# Patient Record
Sex: Female | Born: 1950 | Race: White | Hispanic: No
Health system: Southern US, Community
[De-identification: ages and names within clinical notes are randomized; demographics above are authoritative.]

---

## 1999-04-29 ENCOUNTER — Inpatient Hospital Stay (HOSPITAL_COMMUNITY): Admission: EM | Admit: 1999-04-29 | Discharge: 1999-05-03 | Payer: Self-pay | Admitting: Psychiatry

## 1999-07-21 ENCOUNTER — Other Ambulatory Visit: Admission: RE | Admit: 1999-07-21 | Discharge: 1999-07-21 | Payer: Self-pay | Admitting: Family Medicine

## 2002-07-16 ENCOUNTER — Emergency Department (HOSPITAL_COMMUNITY): Admission: EM | Admit: 2002-07-16 | Discharge: 2002-07-16 | Payer: Self-pay | Admitting: Emergency Medicine

## 2003-05-27 ENCOUNTER — Emergency Department (HOSPITAL_COMMUNITY): Admission: EM | Admit: 2003-05-27 | Discharge: 2003-05-27 | Payer: Self-pay | Admitting: Emergency Medicine

## 2004-03-13 ENCOUNTER — Ambulatory Visit: Payer: Self-pay | Admitting: Family Medicine

## 2004-04-04 ENCOUNTER — Ambulatory Visit: Payer: Self-pay | Admitting: Family Medicine

## 2004-04-07 ENCOUNTER — Ambulatory Visit: Payer: Self-pay | Admitting: Internal Medicine

## 2004-04-08 ENCOUNTER — Ambulatory Visit: Payer: Self-pay | Admitting: *Deleted

## 2004-04-24 ENCOUNTER — Inpatient Hospital Stay (HOSPITAL_COMMUNITY): Admission: EM | Admit: 2004-04-24 | Discharge: 2004-04-30 | Payer: Self-pay | Admitting: Emergency Medicine

## 2004-04-25 ENCOUNTER — Ambulatory Visit: Payer: Self-pay | Admitting: Cardiology

## 2004-04-25 ENCOUNTER — Encounter: Payer: Self-pay | Admitting: Cardiology

## 2004-05-07 ENCOUNTER — Ambulatory Visit: Payer: Self-pay | Admitting: Family Medicine

## 2004-05-12 ENCOUNTER — Ambulatory Visit: Payer: Self-pay | Admitting: Family Medicine

## 2004-05-22 ENCOUNTER — Ambulatory Visit: Payer: Self-pay | Admitting: Family Medicine

## 2004-05-27 ENCOUNTER — Ambulatory Visit (HOSPITAL_COMMUNITY): Admission: RE | Admit: 2004-05-27 | Discharge: 2004-05-27 | Payer: Self-pay | Admitting: Family Medicine

## 2004-09-25 ENCOUNTER — Ambulatory Visit: Payer: Self-pay | Admitting: Family Medicine

## 2006-08-13 ENCOUNTER — Emergency Department (HOSPITAL_COMMUNITY): Admission: EM | Admit: 2006-08-13 | Discharge: 2006-08-13 | Payer: Self-pay | Admitting: Emergency Medicine

## 2009-07-07 ENCOUNTER — Inpatient Hospital Stay (HOSPITAL_COMMUNITY): Admission: EM | Admit: 2009-07-07 | Discharge: 2009-07-11 | Payer: Self-pay | Admitting: Emergency Medicine

## 2009-07-13 ENCOUNTER — Ambulatory Visit: Payer: Self-pay | Admitting: Family Medicine

## 2010-04-06 LAB — COMPREHENSIVE METABOLIC PANEL
ALT: 36 U/L — ABNORMAL HIGH (ref 0–35)
AST: 72 U/L — ABNORMAL HIGH (ref 0–37)
Albumin: 2.1 g/dL — ABNORMAL LOW (ref 3.5–5.2)
Albumin: 2.5 g/dL — ABNORMAL LOW (ref 3.5–5.2)
Alkaline Phosphatase: 253 U/L — ABNORMAL HIGH (ref 39–117)
BUN: 1 mg/dL — ABNORMAL LOW (ref 6–23)
CO2: 28 mEq/L (ref 19–32)
CO2: 29 mEq/L (ref 19–32)
Calcium: 8 mg/dL — ABNORMAL LOW (ref 8.4–10.5)
Calcium: 8.4 mg/dL (ref 8.4–10.5)
Calcium: 8.9 mg/dL (ref 8.4–10.5)
Chloride: 100 mEq/L (ref 96–112)
Chloride: 102 mEq/L (ref 96–112)
Creatinine, Ser: 0.44 mg/dL (ref 0.4–1.2)
GFR calc Af Amer: >60 mL/min (ref >60)
GFR calc non Af Amer: >60 mL/min (ref >60)
GFR calc non Af Amer: >60 mL/min (ref >60)
GFR calc non Af Amer: >60 mL/min (ref >60)
Glucose, Bld: 105 mg/dL — ABNORMAL HIGH (ref 70–99)
Potassium: 3.7 mEq/L (ref 3.5–5.1)
Potassium: 3.8 mEq/L (ref 3.5–5.1)
Sodium: 134 mEq/L — ABNORMAL LOW (ref 135–145)
Total Bilirubin: 0.7 mg/dL (ref 0.3–1.2)
Total Protein: 7.9 g/dL (ref 6.0–8.3)

## 2010-04-06 LAB — SAMPLE TO BLOOD BANK

## 2010-04-06 LAB — CBC
HCT: 41 % (ref 36.0–46.0)
HCT: 50.5 % — ABNORMAL HIGH (ref 36.0–46.0)
MCHC: 32.5 g/dL (ref 30.0–36.0)
MCV: 93.9 fL (ref 78.0–100.0)
MCV: 94 fL (ref 78.0–100.0)
Platelets: ADEQUATE 10*3/uL (ref 150–400)
RDW: 14.5 % (ref 11.5–15.5)
WBC: 12.1 10*3/uL — ABNORMAL HIGH (ref 4.0–10.5)
WBC: 13.4 10*3/uL — ABNORMAL HIGH (ref 4.0–10.5)

## 2010-04-06 LAB — URINALYSIS, ROUTINE W REFLEX MICROSCOPIC
Bilirubin Urine: NEGATIVE
Glucose, UA: NEGATIVE mg/dL
Ketones, ur: NEGATIVE mg/dL
Nitrite: NEGATIVE
Protein, ur: NEGATIVE mg/dL
Specific Gravity, Urine: 1.011 (ref 1.005–1.030)
Urobilinogen, UA: 1 mg/dL (ref 0.0–1.0)

## 2010-04-06 LAB — DIFFERENTIAL
Basophils Absolute: 0.1 10*3/uL (ref 0.0–0.1)
Basophils Absolute: 0.1 10*3/uL (ref 0.0–0.1)
Basophils Relative: 1 % (ref 0–1)
Eosinophils Absolute: 0 10*3/uL (ref 0.0–0.7)
Lymphocytes Relative: 19 % (ref 12–46)
Lymphocytes Relative: 9 % — ABNORMAL LOW (ref 12–46)
Lymphs Abs: 1.2 10*3/uL (ref 0.7–4.0)
Monocytes Relative: 10 % (ref 3–12)
Monocytes Relative: 6 % (ref 3–12)
Neutrophils Relative %: 84 % — ABNORMAL HIGH (ref 43–77)
WBC Morphology: INCREASED

## 2010-04-06 LAB — LIPASE, BLOOD: Lipase: 23 U/L (ref 11–59)

## 2010-04-06 LAB — BASIC METABOLIC PANEL
BUN: 1 mg/dL — ABNORMAL LOW (ref 6–23)
Calcium: 8.1 mg/dL — ABNORMAL LOW (ref 8.4–10.5)
Creatinine, Ser: 0.46 mg/dL (ref 0.4–1.2)
GFR calc non Af Amer: >60 mL/min (ref >60)

## 2010-04-06 LAB — PROTIME-INR
INR: 1.13 (ref 0.00–1.49)
INR: 3.15 — ABNORMAL HIGH (ref 0.00–1.49)
Prothrombin Time: 14.4 seconds (ref 11.6–15.2)
Prothrombin Time: 25.5 seconds — ABNORMAL HIGH (ref 11.6–15.2)
Prothrombin Time: 32.1 seconds — ABNORMAL HIGH (ref 11.6–15.2)
Prothrombin Time: 35.4 seconds — ABNORMAL HIGH (ref 11.6–15.2)

## 2010-04-06 LAB — GAMMA GT: GGT: 278 U/L — ABNORMAL HIGH (ref 7–51)

## 2010-04-06 LAB — URINE CULTURE

## 2010-04-06 LAB — HEPATITIS C ANTIBODY: HCV Ab: NEGATIVE

## 2010-04-06 LAB — HEPATITIS A ANTIBODY, TOTAL: Hep A Total Ab: POSITIVE — AB

## 2010-04-06 LAB — APTT: aPTT: 66 seconds — ABNORMAL HIGH (ref 24–37)

## 2010-06-06 NOTE — Consult Note (Signed)
Stacey Lynch, Stacey Lynch                ACCOUNT NO.:  [id number]   MEDICAL RECORD NO.:  [id number]          PATIENT TYPE:  INP   LOCATION:  0442                         FACILITY:  [facility]   PHYSICIAN:  Pramod P. Pearlean Brownie, MD    DATE OF BIRTH:  [birth year]   DATE OF CONSULTATION:  DATE OF DISCHARGE:                                   CONSULTATION   REFERRING PHYSICIAN:  Dr. Mikeal Hawthorne.   REASON FOR REFERRAL:  Stroke.   HISTORY OF PRESENT ILLNESS:  Stacey Lynch is a 60 year old Caucasian lady who  developed sudden onset of dizziness, gait ataxia, nausea, and vomiting 2  days ago. She was barely able to walk and had to hold on while walking. She  presented to the emergency room at [facility] where she was  admitted. Initial CT scan showed no definite stroke. However, subsequent MRI  has shown bilateral cerebellar infarcts. The patient states nausea and  vomiting have improved in the last couple of days but she still feels quite  ataxic and dizzy when walking. She has been able to walk with some support.  She denies any known prior history of stroke, TIA, seizures, significant  neurological problems.   PAST MEDICAL HISTORY:  Significant for anxiety/depression, gastroesophageal  reflux disease, allergies.   HOME MEDICATIONS:  Wellbutrin, Klonopin, and over-the-counter medications  for allergies.   MEDICATION ALLERGIES:  None.   FAMILY HISTORY:  Significant for brother having had a stroke or a hemorrhage  - the patient is unsure - at age 61. No other neurological problems.   SOCIAL HISTORY:  The patient lives in [place]. She smokes one to two  packs per day for 20 years. She denies drinking alcohol or illicit substance  abuse.   REVIEW OF SYSTEMS:  Not significant for recent fever, loss of weight, cough,  chest pain, diarrhea, or other illness.   PHYSICAL EXAMINATION:  GENERAL:  Reveals a pleasant middle-aged Caucasian  60 year old lady who is not in distress.  VITAL SIGNS:  She is afebrile  today, pulse rate of 72 per minute and  regular, respiratory rate 16 per minute, blood pressure 143/89, saturations  99% on room air. Distal pulses well felt.  HEENT:  Head is nontender. Neck is supple without bruit. ENT exam  unremarkable.  CARDIAC:  No murmur or gallop.  LUNGS:  Clear to auscultation.  NEUROLOGIC:  The patient is pleasant, awake, alert, cooperative. There is no  aphasia, apraxia, or dysarthria. Pupils are equally reactive to light and  accommodation. Eye movements are full range. There is a right beating  nystagmus on right gaze. Visual fields are full to confrontational testing.  Face is symmetric. Palatal movements normal. Tongue is midline. Motor system  exam reveals no upper extremity drift. Symmetric strength, tone, reflexes,  as well as coordination. Finger-to-nose coordination is quite accurate  bilaterally. She is unsteady on a narrow base and while walking tends to  lean to the right. She is unsteady while standing on either foot unsupported  and while walking tandem.   DATA REVIEW:  MRI scan of the brain done yesterday shows  large right  cerebellar infarction in the territory of the posterior inferior cerebellar  artery. There is a smaller infarction in the left cerebellum as well. There  is a probable subacute infarction in the right corona radiata. MRA of the  brain reveals diminished size of intracranial vessels but no high-grade  stenosis or occlusion is seen.   Cardiac echocardiogram and stroke labs are pending.   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 Vascular risk factors are smoking  and probable hypertension, and hyperlipidemia and diabetes.   PLAN:  I would recommend changing aspirin to Aggrenox for secondary stroke  prevention. Obtain MRA of the neck with and without contrast to rule out  possible vertebral artery stenosis. Check 2-D echocardiogram as well as  fasting  lipid profile, hemoglobin A1c, and homocysteine levels. Continue  physical and occupational therapy. The patient will benefit with rehab  consult. Also, telemetry monitoring for cardiac arrhythmias. The patient has  agreed to quit smoking and she will benefit with smoking cessation  counseling. I look forward to seeing her in follow-up and call for  questions.      PPS/MEDQ  D:  04/25/2004  T:  04/25/2004  Job:  [id number]   cc:   Clydie Braun L. Hal Hope, M.D.  [address] [place]  [place] [address]  Fax: [phone]   [facility]

## 2010-06-06 NOTE — H&P (Signed)
NAMETEJAH, BREKKE                ACCOUNT NO.:  [id number]   MEDICAL RECORD NO.:  [id number]          PATIENT TYPE:  EMS   LOCATION:  ED                           FACILITY:  [facility]   PHYSICIAN:  Gertha Calkin, M.D.DATE OF BIRTH:  [birth year]   DATE OF ADMISSION:  04/23/2004  DATE OF DISCHARGE:                                HISTORY & PHYSICAL   PRIMARY CARE PHYSICIAN:  Clydie Braun L. Hal Hope, M.D. at [facility].   This is a 60 year old Caucasian female with a past medical history of  anxiety/depression, gastroesophageal reflux disease, allergies who presents  with acute onset of presyncope earlier this afternoon, followed by nausea,  and vomited multiple times.  Denies any abdominal pain.  States that she is  having a headache which started this a.m.  No photophobia.  She also states  after this episode of presyncope, that her brother witnessed, the patient  has been unsteady on her feet with questionable vertigo like symptoms.  Denies any chest pain, palpitations, seizures, or postictal state.  No  recent colds or cough or acute illnesses.  No changes in appetite or p.o.  intake.  No sick contacts.   PAST MEDICAL HISTORY:  1.  Anxiety/depression.  2.  Esophageal reflux disease.  3.  Allergies.   MEDICATIONS:  1.  Wellbutrin 300 mg p.o. every day.  2.  Klonopin 0.5 mg p.o. b.i.d.  3.  She takes over the counter medications for allergies and sometimes has      baking soda for her indigestion.   ALLERGIES:  None.   FAMILY HISTORY:  Positive for a cranial bleed in her brother when he was  young approximately 7-years-of-age.  Otherwise no high blood pressure,  seizure disorder, hypertension, aneurysms that are known.   SOCIAL HISTORY:  She lives here in [place], smokes approximately a pack a  day.  Denies alcohol or other illicit substance abuse.   REVIEW OF SYSTEMS:  Pertinent for unsteady gait with acute symptoms and  questionable slurred speech as well.  The slurred  speech has resolved at the  time of H&P.  Denies any problems with visual acuity or ear symptoms.  No  tinnitus.  No chest pain, palpitations, shortness of breath.  No diarrhea,  fever, or chills.  Otherwise the rest of the review of systems negative.   PHYSICAL EXAMINATION:  VITAL SIGNS:  Temperature is 98.5, blood pressure  124/92, pulse of 76, respirations of 20, 95% on room air.  HEENT:  Unremarkable.  There is no nystagmus.  Pupils equal, round, and  reactive to light.  Extraocular movements are intact.  Oropharynx is clear.  Tympanic membranes are clear.  NECK:  Without bruits.  No lymphadenopathy.  No masses at all.  CARDIOVASCULAR:  Regular rate and rhythm.  No murmurs, rubs or gallops.  CHEST:  Clear to auscultation bilaterally with good air movement.  ABDOMEN:  Soft, nontender, nondistended.  Bowel sounds present.  EXTREMITIES:  Without clubbing, cyanosis, or edema.  NEUROLOGIC:  Cranial nerves II-XII are intact.  Sensation is intact.  Strength is intact and symmetric.  Reflexes are intact and  symmetric.  She  has no dysdiadochokinesia.  She has no abnormalities in rapid alternating  movements.  She is without past-pointing.  Dorsal columns are intact.  Toes  are downgoing.   LABS:  White count of 18.9, hemoglobin 18.2, platelets 288.  UA was  essentially negative with trace leukocyte esterase but 0-2 white blood cells  on the micro.  Sodium is 132, potassium 5.3, chloride of 101, bicarb of 22,  glucose 191, BUN of 4, creatinine 0.7.  LFTs were normal.  Lipase of 15.  CT  of the head without contrast shows negative for any acute findings.   ASSESSMENT:  1.  Presyncope.  2.  Nausea, vomiting.  3.  Unsteady gait.  4.  Hyperglycemia.  5.  Leukocytosis.  6.  Anxiety/depression.   PLAN:  1.  Admit.  2.  Obtain an MRI/MRA to rule out any acute CVAs and also look at the      posterior circulation.  Possibility of aneurysm given her questionable      history in the family of  her brother having one.  3.  Also check transcranial Doppler studies as well as carotid Doppler      studies rule out any low perfusion states.  4.  We will check orthostatic blood pressures and currently hydrate with IV      fluids.  5.  Follow up her electrolytes.  6.  Put her on sliding scale for hyperglycemia.  7.  Follow up her CBC.  She seems to have some increase hemoglobin as well      as elevated white count, do not know if this is hydration related.      There is no obvious source of infection and wonder if this is all      reactive +- low volume state.  8.  Also obtain PT/ OT consult.  9.  DVTand GI prophylaxis will be given with Pepcid and PAS hose.  10. We will start her empirically with aspirin to treat a possible TIA.      This may end up being vertigo but we will rule out these other      possibilities first.      JD/MEDQ  D:  04/24/2004  T:  04/24/2004  Job:  [id number]   cc:   Clydie Braun L. Hal Hope, M.D.  [address] [place]  [place] [address]  Fax: [phone]

## 2010-06-06 NOTE — Discharge Summary (Signed)
[facility]  Patient:    Stacey Lynch, Stacey Lynch                       MRN: [id number] Adm. Date:  [id number] Disc. Date: [id number] Attending:  Fortunato Curling                           Discharge Summary  HISTORY:  The patient is a 59 year old divorced white female who was admitted on a voluntary basis to the [facility].  The patient has a long history of episodic alcohol abuse and who has been drinking more than 18 beers per day over the last month.  She now presents for acute alcohol detoxification.  She has not been eating, sleeping or taking her medications during this time.  She reported that she had been very depressed, hopeless, helpless, but was not acutely suicidal.  Her blood alcohol level in the [facility] was 161.  She has a history of significant alcohol withdrawal symptoms and has a history of having had alcohol seizures previously.  LABORATORY DATA:  CBC revealed a normal white cell count with a hemoglobin at 15.4, hematocrit of 45.3, with a normal differential.  Urinalysis was negative. Chemistry panel revealed a slightly increased total protein of 8.1, an elevated AST of 68 but other liver function tests were all within normal limits.  HOSPITAL COURSE:  The patient was admitted to the psychiatric unit at the [facility] under the care of Dr. Fortunato Curling. She was initially placed on 15-minute checks and therapeutic level 2 and on alcohol detoxification protocol.  She was started on Librium 25 mg four times a day, which was titrated over the ensuing four days; she was also given a vitamin daily and thiamine.  Because of her underlying level of depression, the patient was started on Serzone 50 mg b.i.d., which was gradually increased to a final dosage of 100 mg b.i.d.  The patient also required p.r.n. doses of trazodone to help with sleep.  During the short hospitalization,  the patient actively participated in all aspects of intensive milieu therapy including daily individual and group psychotherapies.  She attended AA.  She seemed motivated for pursuing outpatient treatment upon the time of discharge.  Thus, by May 03, 1999, I felt that the patients condition was adequately stabilized that she could be safely discharged home.  DISCHARGE MEDICATIONS: 1. Serzone 100 mg q.a.m., 200 mg q.h.s. 2. Zyrtec 10 mg daily. 3. Trazodone 100 mg q.h.s.  FOLLOWUP:  Followup for this patient will be with Dr. Elna Breslow for medication management.  The patient will also attend regular meetings with AA.  DISCHARGE DIAGNOSES:   Axes I:  1. Alcohol dependence.            2. Major depression, recurrent, of moderate severity.  Axis II:  None. Axis III:  None.  Axis IV:  Stressors, moderate.   Axis V:  Current level of functioning is 50; highest in the past year is 80. DD:  06/16/99 TD:  06/17/99 Job: [id number]

## 2010-06-06 NOTE — Discharge Summary (Signed)
NAMEREBEL, Stacey Lynch                ACCOUNT NO.:  [id number]   MEDICAL RECORD NO.:  [id number]          PATIENT TYPE:  INP   LOCATION:  0442                         FACILITY:  [facility]   PHYSICIAN:  Isidor Holts, M.D.  DATE OF BIRTH:  02-Aug-1950   DATE OF ADMISSION:  04/23/2004  DATE OF DISCHARGE:  04/30/2004                                 DISCHARGE SUMMARY   PMD:  Clydie Braun L. Hal Hope, M.D. at [facility].   DISCHARGE DIAGNOSES:  1.  Bilateral cerebellar cerebrovascular accident.  2.  Hypertension.  3.  Dyslipidemia.  4.  Hyperhomocysteinemia.  5.  Depression/anxiety.  6.  Smoker.   DISCHARGE MEDICATIONS:  1.  Xanax 0.25 mg p.o. b.i.d.  2.  Wellbutrin XL 300 mg p.o. daily (Patient has own medications      preadmission).  3.  Lexapro 10 mg p.o. daily.  4.  Plavix 75 mg p.o. daily.  5.  Lisinopril 5 mg p.o. daily.  6.  Folic acid 1 mg p.o. daily.  7.  Zocor 20 mg p.o. q.h.s.  8.  Nicoderm CQ patch 21 mg x24 hours 1 patch to skin daily.   PROCEDURES:  1.  Brain CT scan dated April 23, 2004.  This was a negative noncontrast head      CT.  2.  Brain MRI/MRA dated April 24, 2004:  This showed acute stroke effecting      the cerebellum, more extensive on the right than on the left in the      posterior-inferior cerebellar artery distribution.  Subacute infarction      also evident in the hemispheric white matter and right anterior parietal      region.  Normal intracranial MR angiography in the large and medium-      sized vessels.  Specifically, no cerebellar vessel occlusion is seen.  3.  MRA of the neck on April 25, 2004:  Negative MR angiography of the      extracranial circulation with contrast.  4.  CT echocardiogram dated April 25, 2004:  This showed overall vigorous      left ventricular systolic function.  LVEF 65-75%.  No left ventricular      regional wall motion abnormalities.  Left atrial size was at the upper      limits of normal.  5.  Carotid duplex dated April 24, 2004:  This showed mild plaque throughout      bilaterally but no ICA stenosis.  Antegrade vertebral flow bilaterally.   CONSULTATIONS:  Dr. Delia Heady, neurology.   ADMISSION HISTORY:  As in H&P notes of April 24, 2004; however, in brief,  this is a 60 year old Caucasian female with known history of  anxiety/depression, GERD, and allergies, who presents with acute presyncopal  episode followed by nausea and vomiting.  She also experienced some vertigo  and ataxia.  She was admitted for further evaluation, investigation, and  management.   CLINICAL COURSE:  1.  Acute bilateral cerebellar CVAs:  Initial physical evaluation,      demonstrated no focal neurologic deficits other than gait instability.  Brain CT scan showed no acute findings; however, subsequent brain MRI      demonstrated acute strokes effecting the cerebellum, more extensive on      the right than on the left in the posterior-inferior cerebellar artery      distribution.  Also, a subacute infarct in the hemispheric white matter      in the right anterior parietal region.  Brain and cervical MRA showed no      evidence of vascular stenosis.  Neither did carotid duplex scan,      although this did indicate scattered mild atherosclerotic plaques      bilaterally.  A 2D echocardiogram was quite unremarkable.  Left      ventricular function was quite vigorous.  Neurologic consultation was      called, which was kindly provided by Dr. Delia Heady, who recommended      placing the patient on Aggrenox and continuing physical/occupational      therapy as well as risk factor modification.  The patient's gait      improved steadily during the course of the hospital stay. and vertigo      resolved.   1.  Dyslipidemia:  As part of the patient's initial investigations, a      fasting lipid profile was done and revealed the following findings:      Total cholesterol 191, triglycerides 140, HDL 46, LDL 117.  Given the       patient's recent cerebrovascular event, target LDL is considered at      least below 100.  The patient was therefore commenced on statin      treatment.   1.  Hyperhomocysteinemia:  The patient was found to have a homocysteine      level of 16.5, i.e., elevated, with a normal B12 level.  She has been      commenced on folic acid accordingly.   1.  Hypertension:  The patient has no prior history of hypertension;      however, following her cerebrovascular accident, she was found to have      blood pressures ranging in the low 140s systolic.  Initially, this was      not treated, given her recent cerebrovascular event. However, her blood      pressure gradually drifted up into the high 140s, necessitating      commencement of an ACE inhibitor.  This resulted in excellent blood      pressure control.   1.  History of anxiety/depression:  The patient was already on Wellbutrin,      pre-admission.  During her hospital stay, she exhibited increased level      of anxiety and a great deal of concern about her condition and how this      was subsequently going to affect her in the future.  Because of this      clinical depression, she was commenced on Lexapro in addition to      Wellbutrin.  Patient's family also expressed a great deal of concern.      As a matter of fact, they actually wrote letters to this M.D. describing      their relative`s state of mind and how she had appeared quite depressed      even prior to the current admission.  Because of these concerns, a      psychiatric consultation was called, which was kindly provided by Dr.      Jeanie Sewer, who approved continuation of treatment as already  established, i.e., Wellbutrin XL and Lexapro, and also recommended that      the patient follow up with [facility] as an outpatient.  He recommended      tapering patient off xanax, in the outpatient setting. At the time of     discharge on April 30, 2004, the patient's mood had  stabilized.   1.  Smoking history:  Prior to admission, the patient smoked approximately      one pack of cigarettes a day.  She has been counseled and placed on      Nicoderm CQ patch.   DISPOSITION:  The patient is discharged in satisfactory condition on April 30, 2004.   PAIN MANAGEMENT:  Not applicable.   ACTIVITY:  Per home health, physical therapy, occupational therapy.   DIET:  Healthy heart diet.   WOUND CARE:  Not applicable.   SPECIAL INSTRUCTIONS:  Arrangements have been made for home health physical  therapy and occupational therapy.  A wheelchair is ordered for social  occasions, a walker, and also a bedside commode.  The patient has been  instructed to follow up with [facility].  The appropriate telephone number has been  supplied.   FOLLOW-UP ARRANGEMENTS:  Patient is to follow up with PMD, Dr. Nadyne Coombes, at [facility], within one week.  Patient's PMD is expected to  taper her Xanax, as per psychiatric recommendations.      CO/MEDQ  D:  05/05/2004  T:  05/05/2004  Job:  [id number]   cc:   Pramod P. Pearlean Brownie, MD  Fax: [phone]   Antonietta Breach, M.D.   Marcos Eke. Hal Hope, M.D.  [address]  [place]  [place] [address]  Fax: [phone]

## 2010-11-18 ENCOUNTER — Ambulatory Visit: Payer: Self-pay | Admitting: Emergency Medicine

## 2010-11-20 LAB — PATHOLOGY REPORT

## 2011-04-18 ENCOUNTER — Emergency Department: Payer: Self-pay | Admitting: Emergency Medicine

## 2011-04-18 LAB — URINALYSIS, COMPLETE
Bilirubin,UR: NEGATIVE
Blood: NEGATIVE
Glucose,UR: NEGATIVE mg/dL (ref 0–75)
Leukocyte Esterase: NEGATIVE
Nitrite: NEGATIVE
Ph: 6 (ref 4.5–8.0)
RBC,UR: NONE SEEN /HPF (ref 0–5)
Specific Gravity: 1.001 (ref 1.003–1.030)
WBC UR: <1 /HPF (ref 0–5)

## 2011-07-03 ENCOUNTER — Emergency Department: Payer: Self-pay | Admitting: Emergency Medicine

## 2011-07-03 LAB — CBC
HCT: 48.4 % — ABNORMAL HIGH (ref 35.0–47.0)
HGB: 15.8 g/dL (ref 12.0–16.0)
MCH: 29.3 pg (ref 26.0–34.0)
MCHC: 32.8 g/dL (ref 32.0–36.0)
MCV: 89 fL (ref 80–100)
Platelet: 284 10*3/uL (ref 150–440)
RBC: 5.41 10*6/uL — ABNORMAL HIGH (ref 3.80–5.20)
RDW: 13.7 % (ref 11.5–14.5)
WBC: 12.6 10*3/uL — ABNORMAL HIGH (ref 3.6–11.0)

## 2011-07-03 LAB — BASIC METABOLIC PANEL
Anion Gap: 9 (ref 7–16)
BUN: 7 mg/dL (ref 7–18)
Calcium, Total: 10 mg/dL (ref 8.5–10.1)
Chloride: 92 mmol/L — ABNORMAL LOW (ref 98–107)
Co2: 29 mmol/L (ref 21–32)
Creatinine: 0.63 mg/dL (ref 0.60–1.30)
EGFR (African American): >60
EGFR (Non-African Amer.): >60
Glucose: 103 mg/dL — ABNORMAL HIGH (ref 65–99)
Osmolality: 259 (ref 275–301)
Potassium: 3.6 mmol/L (ref 3.5–5.1)
Sodium: 130 mmol/L — ABNORMAL LOW (ref 136–145)

## 2011-07-09 ENCOUNTER — Ambulatory Visit: Payer: Self-pay | Admitting: Oncology

## 2011-07-12 DIAGNOSIS — I4891 Unspecified atrial fibrillation: Secondary | ICD-10-CM

## 2011-07-14 LAB — CREATININE, SERUM: EGFR (African American): >60

## 2011-07-15 ENCOUNTER — Ambulatory Visit: Payer: Self-pay | Admitting: Oncology

## 2011-07-17 ENCOUNTER — Ambulatory Visit: Payer: Self-pay | Admitting: Internal Medicine

## 2011-07-20 ENCOUNTER — Ambulatory Visit: Payer: Self-pay | Admitting: Oncology

## 2011-07-24 ENCOUNTER — Ambulatory Visit: Payer: Self-pay | Admitting: Oncology

## 2011-08-03 LAB — CBC CANCER CENTER
Basophil #: 0.1 x10 3/mm (ref 0.0–0.1)
Eosinophil #: 0.2 x10 3/mm (ref 0.0–0.7)
Lymphocyte #: 1.3 x10 3/mm (ref 1.0–3.6)
Lymphocyte %: 9.7 %
MCHC: 32.2 g/dL (ref 32.0–36.0)
MCV: 87 fL (ref 80–100)
Monocyte %: 11.4 %
Neutrophil #: 10 x10 3/mm — ABNORMAL HIGH (ref 1.4–6.5)
Platelet: 364 x10 3/mm (ref 150–440)
RDW: 14.2 % (ref 11.5–14.5)

## 2011-08-03 LAB — COMPREHENSIVE METABOLIC PANEL
Albumin: 2.2 g/dL — ABNORMAL LOW (ref 3.4–5.0)
BUN: 3 mg/dL — ABNORMAL LOW (ref 7–18)
Bilirubin,Total: 0.5 mg/dL (ref 0.2–1.0)
Chloride: 94 mmol/L — ABNORMAL LOW (ref 98–107)
Creatinine: 0.69 mg/dL (ref 0.60–1.30)
Potassium: 3.5 mmol/L (ref 3.5–5.1)
Sodium: 133 mmol/L — ABNORMAL LOW (ref 136–145)
Total Protein: 7.6 g/dL (ref 6.4–8.2)

## 2011-08-04 ENCOUNTER — Ambulatory Visit: Payer: Self-pay | Admitting: Vascular Surgery

## 2011-08-07 ENCOUNTER — Inpatient Hospital Stay: Payer: Self-pay | Admitting: Internal Medicine

## 2011-08-07 LAB — COMPREHENSIVE METABOLIC PANEL
Albumin: 2.4 g/dL — ABNORMAL LOW (ref 3.4–5.0)
Alkaline Phosphatase: 237 U/L — ABNORMAL HIGH (ref 50–136)
Anion Gap: 8 (ref 7–16)
Bilirubin,Total: 0.5 mg/dL (ref 0.2–1.0)
Calcium, Total: 9.1 mg/dL (ref 8.5–10.1)
Chloride: 95 mmol/L — ABNORMAL LOW (ref 98–107)
Co2: 30 mmol/L (ref 21–32)
Creatinine: 0.53 mg/dL — ABNORMAL LOW (ref 0.60–1.30)
EGFR (Non-African Amer.): >60
Osmolality: 263 (ref 275–301)
Potassium: 3.5 mmol/L (ref 3.5–5.1)

## 2011-08-07 LAB — URINALYSIS, COMPLETE
Leukocyte Esterase: NEGATIVE
Nitrite: NEGATIVE
Ph: 6 (ref 4.5–8.0)
Protein: NEGATIVE
RBC,UR: <1 /HPF (ref 0–5)
Squamous Epithelial: 5

## 2011-08-07 LAB — CBC
HCT: 43.4 % (ref 35.0–47.0)
MCH: 27.2 pg (ref 26.0–34.0)
MCV: 88 fL (ref 80–100)
RDW: 14 % (ref 11.5–14.5)
WBC: 14.7 10*3/uL — ABNORMAL HIGH (ref 3.6–11.0)

## 2011-08-07 LAB — DRUG SCREEN, URINE
Barbiturates, Ur Screen: NEGATIVE (ref ?–200)
Benzodiazepine, Ur Scrn: POSITIVE (ref ?–200)
Cocaine Metabolite,Ur ~~LOC~~: NEGATIVE (ref ?–300)
MDMA (Ecstasy)Ur Screen: NEGATIVE (ref ?–500)
Tricyclic, Ur Screen: POSITIVE (ref ?–1000)

## 2011-08-08 LAB — COMPREHENSIVE METABOLIC PANEL
Alkaline Phosphatase: 211 U/L — ABNORMAL HIGH (ref 50–136)
Bilirubin,Total: 0.5 mg/dL (ref 0.2–1.0)
Calcium, Total: 8.9 mg/dL (ref 8.5–10.1)
Chloride: 96 mmol/L — ABNORMAL LOW (ref 98–107)
Co2: 31 mmol/L (ref 21–32)
EGFR (Non-African Amer.): >60
SGOT(AST): 16 U/L (ref 15–37)
SGPT (ALT): 11 U/L — ABNORMAL LOW

## 2011-08-08 LAB — CBC WITH DIFFERENTIAL/PLATELET
Eosinophil %: 0.4 %
HCT: 41.1 % (ref 35.0–47.0)
Lymphocyte #: 1.3 10*3/uL (ref 1.0–3.6)
MCV: 89 fL (ref 80–100)
Monocyte %: 9.2 %
Neutrophil #: 10 10*3/uL — ABNORMAL HIGH (ref 1.4–6.5)
RBC: 4.62 10*6/uL (ref 3.80–5.20)
WBC: 12.5 10*3/uL — ABNORMAL HIGH (ref 3.6–11.0)

## 2011-08-08 LAB — LIPID PANEL
HDL Cholesterol: 34 mg/dL — ABNORMAL LOW (ref 40–60)
Ldl Cholesterol, Calc: 58 mg/dL (ref 0–100)
Triglycerides: 101 mg/dL (ref 0–200)
VLDL Cholesterol, Calc: 20 mg/dL (ref 5–40)

## 2011-08-09 LAB — COMPREHENSIVE METABOLIC PANEL
Anion Gap: 13 (ref 7–16)
Bilirubin,Total: 0.5 mg/dL (ref 0.2–1.0)
Chloride: 96 mmol/L — ABNORMAL LOW (ref 98–107)
Co2: 25 mmol/L (ref 21–32)
Creatinine: 0.27 mg/dL — ABNORMAL LOW (ref 0.60–1.30)
EGFR (African American): >60
EGFR (Non-African Amer.): >60
Osmolality: 265 (ref 275–301)
Potassium: 3 mmol/L — ABNORMAL LOW (ref 3.5–5.1)
Sodium: 134 mmol/L — ABNORMAL LOW (ref 136–145)

## 2011-08-09 LAB — AMMONIA: Ammonia, Plasma: <25 mcmol/L (ref 11–32)

## 2011-08-09 LAB — CBC WITH DIFFERENTIAL/PLATELET
Basophil #: 0.1 10*3/uL (ref 0.0–0.1)
Basophil %: 0.7 %
Eosinophil %: 0.1 %
HCT: 39.9 % (ref 35.0–47.0)
HGB: 13 g/dL (ref 12.0–16.0)
Lymphocyte #: 1.5 10*3/uL (ref 1.0–3.6)
Lymphocyte %: 10.5 %
MCV: 88 fL (ref 80–100)
Monocyte %: 7.1 %
Neutrophil #: 11.4 10*3/uL — ABNORMAL HIGH (ref 1.4–6.5)
RDW: 13.9 % (ref 11.5–14.5)
WBC: 14 10*3/uL — ABNORMAL HIGH (ref 3.6–11.0)

## 2011-08-10 LAB — CBC WITH DIFFERENTIAL/PLATELET
Basophil %: 0.5 %
Eosinophil #: 0 10*3/uL (ref 0.0–0.7)
Eosinophil %: 0.3 %
HCT: 39.7 % (ref 35.0–47.0)
Lymphocyte %: 12 %
MCH: 28 pg (ref 26.0–34.0)
MCHC: 32.1 g/dL (ref 32.0–36.0)
Neutrophil #: 10.5 10*3/uL — ABNORMAL HIGH (ref 1.4–6.5)
WBC: 13.4 10*3/uL — ABNORMAL HIGH (ref 3.6–11.0)

## 2011-08-10 LAB — COMPREHENSIVE METABOLIC PANEL
Albumin: 2.2 g/dL — ABNORMAL LOW (ref 3.4–5.0)
Anion Gap: 12 (ref 7–16)
BUN: 3 mg/dL — ABNORMAL LOW (ref 7–18)
Glucose: 120 mg/dL — ABNORMAL HIGH (ref 65–99)
Osmolality: 268 (ref 275–301)
Potassium: 3.9 mmol/L (ref 3.5–5.1)
Sodium: 135 mmol/L — ABNORMAL LOW (ref 136–145)
Total Protein: 7.5 g/dL (ref 6.4–8.2)

## 2011-08-11 LAB — CBC WITH DIFFERENTIAL/PLATELET
Basophil #: 0.1 10*3/uL (ref 0.0–0.1)
Eosinophil %: 0.2 %
HCT: 42.3 % (ref 35.0–47.0)
HGB: 13.3 g/dL (ref 12.0–16.0)
Lymphocyte #: 1.7 10*3/uL (ref 1.0–3.6)
Lymphocyte %: 11.3 %
MCV: 88 fL (ref 80–100)
Monocyte #: 1.6 x10 3/mm — ABNORMAL HIGH (ref 0.2–0.9)
Monocyte %: 10.5 %
Neutrophil #: 11.9 10*3/uL — ABNORMAL HIGH (ref 1.4–6.5)
Neutrophil %: 77.5 %
Platelet: 355 10*3/uL (ref 150–440)
RBC: 4.79 10*6/uL (ref 3.80–5.20)
WBC: 15.3 10*3/uL — ABNORMAL HIGH (ref 3.6–11.0)

## 2011-08-11 LAB — BASIC METABOLIC PANEL
Anion Gap: 11 (ref 7–16)
Calcium, Total: 9.4 mg/dL (ref 8.5–10.1)
Chloride: 98 mmol/L (ref 98–107)
Co2: 25 mmol/L (ref 21–32)
EGFR (Non-African Amer.): >60
Glucose: 106 mg/dL — ABNORMAL HIGH (ref 65–99)
Osmolality: 265 (ref 275–301)
Potassium: 4 mmol/L (ref 3.5–5.1)
Sodium: 134 mmol/L — ABNORMAL LOW (ref 136–145)

## 2011-08-12 LAB — CBC WITH DIFFERENTIAL/PLATELET
Basophil #: 0.2 10*3/uL — ABNORMAL HIGH (ref 0.0–0.1)
Lymphocyte #: 1.5 10*3/uL (ref 1.0–3.6)
Lymphocyte %: 8.4 %
MCH: 26.7 pg (ref 26.0–34.0)
MCV: 88 fL (ref 80–100)
Monocyte #: 1.9 x10 3/mm — ABNORMAL HIGH (ref 0.2–0.9)
Monocyte %: 10.8 %
Platelet: 430 10*3/uL (ref 150–440)
RDW: 14.3 % (ref 11.5–14.5)
WBC: 17.2 10*3/uL — ABNORMAL HIGH (ref 3.6–11.0)

## 2011-08-13 LAB — BASIC METABOLIC PANEL
Anion Gap: 10 (ref 7–16)
BUN: 9 mg/dL (ref 7–18)
Creatinine: 0.46 mg/dL — ABNORMAL LOW (ref 0.60–1.30)
EGFR (African American): >60
Glucose: 127 mg/dL — ABNORMAL HIGH (ref 65–99)
Osmolality: 259 (ref 275–301)
Sodium: 129 mmol/L — ABNORMAL LOW (ref 136–145)

## 2011-08-13 LAB — CBC WITH DIFFERENTIAL/PLATELET
Basophil #: 0.1 10*3/uL (ref 0.0–0.1)
Eosinophil #: 0 10*3/uL (ref 0.0–0.7)
MCH: 27.5 pg (ref 26.0–34.0)
MCHC: 31.1 g/dL — ABNORMAL LOW (ref 32.0–36.0)
Monocyte #: 0.6 x10 3/mm (ref 0.2–0.9)
Monocyte %: 4.1 %
Neutrophil #: 12 10*3/uL — ABNORMAL HIGH (ref 1.4–6.5)
Neutrophil %: 89.1 %
Platelet: 416 10*3/uL (ref 150–440)

## 2011-08-13 LAB — CULTURE, BLOOD (SINGLE)

## 2011-08-18 LAB — BASIC METABOLIC PANEL
Anion Gap: 10 (ref 7–16)
BUN: 9 mg/dL (ref 7–18)
Calcium, Total: 10 mg/dL (ref 8.5–10.1)
Co2: 29 mmol/L (ref 21–32)
Creatinine: 0.5 mg/dL — ABNORMAL LOW (ref 0.60–1.30)
EGFR (Non-African Amer.): >60
Glucose: 101 mg/dL — ABNORMAL HIGH (ref 65–99)
Osmolality: 267 (ref 275–301)
Sodium: 134 mmol/L — ABNORMAL LOW (ref 136–145)

## 2011-08-18 LAB — CBC WITH DIFFERENTIAL/PLATELET
Eosinophil #: 0.1 10*3/uL (ref 0.0–0.7)
Eosinophil %: 0.4 %
MCH: 29.3 pg (ref 26.0–34.0)
MCHC: 34 g/dL (ref 32.0–36.0)
Monocyte #: 2 x10 3/mm — ABNORMAL HIGH (ref 0.2–0.9)
Neutrophil %: 77.5 %
Platelet: 541 10*3/uL — ABNORMAL HIGH (ref 150–440)
RBC: 4.79 10*6/uL (ref 3.80–5.20)

## 2011-08-20 ENCOUNTER — Ambulatory Visit: Payer: Self-pay | Admitting: Oncology

## 2011-08-28 LAB — CBC CANCER CENTER
Basophil #: 0.2 x10 3/mm — ABNORMAL HIGH (ref 0.0–0.1)
Basophil %: 1 %
Eosinophil #: 0.1 x10 3/mm (ref 0.0–0.7)
Eosinophil %: 0.6 %
HGB: 13.1 g/dL (ref 12.0–16.0)
Lymphocyte #: 2.3 x10 3/mm (ref 1.0–3.6)
Lymphocyte %: 10.6 %
MCHC: 33.5 g/dL (ref 32.0–36.0)
MCV: 87 fL (ref 80–100)
Monocyte #: 2.1 x10 3/mm — ABNORMAL HIGH (ref 0.2–0.9)
Monocyte %: 9.7 %
Neutrophil #: 16.8 x10 3/mm — ABNORMAL HIGH (ref 1.4–6.5)
Platelet: 483 x10 3/mm — ABNORMAL HIGH (ref 150–440)
RDW: 14.5 % (ref 11.5–14.5)

## 2011-08-28 LAB — COMPREHENSIVE METABOLIC PANEL
Albumin: 2.5 g/dL — ABNORMAL LOW (ref 3.4–5.0)
Anion Gap: 9 (ref 7–16)
Calcium, Total: 9.1 mg/dL (ref 8.5–10.1)
Glucose: 109 mg/dL — ABNORMAL HIGH (ref 65–99)
Osmolality: 268 (ref 275–301)
Potassium: 3.9 mmol/L (ref 3.5–5.1)
Sodium: 134 mmol/L — ABNORMAL LOW (ref 136–145)
Total Protein: 7.1 g/dL (ref 6.4–8.2)

## 2011-09-01 LAB — CBC CANCER CENTER
Basophil #: 0.2 x10 3/mm — ABNORMAL HIGH (ref 0.0–0.1)
Eosinophil #: 0.2 x10 3/mm (ref 0.0–0.7)
Eosinophil %: 1.1 %
HGB: 12 g/dL (ref 12.0–16.0)
Lymphocyte #: 1.3 x10 3/mm (ref 1.0–3.6)
Lymphocyte %: 8.6 %
MCH: 27.7 pg (ref 26.0–34.0)
MCV: 87 fL (ref 80–100)
Monocyte %: 2 %
Neutrophil %: 87 %
Platelet: 308 x10 3/mm (ref 150–440)
RBC: 4.33 10*6/uL (ref 3.80–5.20)
WBC: 15.1 x10 3/mm — ABNORMAL HIGH (ref 3.6–11.0)

## 2011-09-03 ENCOUNTER — Inpatient Hospital Stay: Payer: Self-pay | Admitting: Internal Medicine

## 2011-09-03 LAB — URINALYSIS, COMPLETE
Glucose,UR: NEGATIVE mg/dL (ref 0–75)
Hyaline Cast: 2
Ketone: NEGATIVE
Ph: 5 (ref 4.5–8.0)
Protein: 30
RBC,UR: 2 /HPF (ref 0–5)
Specific Gravity: 1.027 (ref 1.003–1.030)
Squamous Epithelial: 6

## 2011-09-03 LAB — COMPREHENSIVE METABOLIC PANEL
Albumin: 2.1 g/dL — ABNORMAL LOW (ref 3.4–5.0)
Alkaline Phosphatase: 728 U/L — ABNORMAL HIGH (ref 50–136)
BUN: 9 mg/dL (ref 7–18)
Bilirubin,Total: 0.5 mg/dL (ref 0.2–1.0)
Chloride: 97 mmol/L — ABNORMAL LOW (ref 98–107)
Co2: 29 mmol/L (ref 21–32)
Creatinine: 0.55 mg/dL — ABNORMAL LOW (ref 0.60–1.30)
EGFR (African American): >60
EGFR (Non-African Amer.): >60
Osmolality: 269 (ref 275–301)
SGOT(AST): 40 U/L — ABNORMAL HIGH (ref 15–37)
SGPT (ALT): 57 U/L (ref 12–78)
Sodium: 134 mmol/L — ABNORMAL LOW (ref 136–145)
Total Protein: 7 g/dL (ref 6.4–8.2)

## 2011-09-03 LAB — CK TOTAL AND CKMB (NOT AT ARMC)
CK, Total: 10 U/L — ABNORMAL LOW (ref 21–215)
CK-MB: <0.5 ng/mL — ABNORMAL LOW (ref 0.5–3.6)

## 2011-09-03 LAB — CBC
HCT: 32.7 % — ABNORMAL LOW (ref 35.0–47.0)
MCV: 87 fL (ref 80–100)
Platelet: 324 10*3/uL (ref 150–440)
RBC: 3.78 10*6/uL — ABNORMAL LOW (ref 3.80–5.20)
RDW: 13.9 % (ref 11.5–14.5)
WBC: 9.8 10*3/uL (ref 3.6–11.0)

## 2011-09-03 LAB — DIFFERENTIAL
Basophil #: 0.1 10*3/uL (ref 0.0–0.1)
Basophil %: 1.1 %
Eosinophil #: 0.2 10*3/uL (ref 0.0–0.7)
Lymphocyte #: 0.7 10*3/uL — ABNORMAL LOW (ref 1.0–3.6)
Monocyte #: 0.6 x10 3/mm (ref 0.2–0.9)
Monocyte %: 6.5 %

## 2011-09-03 LAB — MAGNESIUM: Magnesium: 2.3 mg/dL

## 2011-09-03 LAB — TSH: Thyroid Stimulating Horm: 2.07 u[IU]/mL

## 2011-09-03 LAB — APTT: Activated PTT: 29.9 secs (ref 23.6–35.9)

## 2011-09-03 LAB — PROTIME-INR
INR: 0.9
Prothrombin Time: 12.2 secs (ref 11.5–14.7)

## 2011-09-03 LAB — PRO B NATRIURETIC PEPTIDE: B-Type Natriuretic Peptide: 750 pg/mL — ABNORMAL HIGH (ref 0–125)

## 2011-09-03 LAB — TROPONIN I: Troponin-I: <0.02 ng/mL

## 2011-09-04 LAB — BASIC METABOLIC PANEL
Calcium, Total: 8.6 mg/dL (ref 8.5–10.1)
EGFR (Non-African Amer.): >60
Glucose: 94 mg/dL (ref 65–99)
Potassium: 3.6 mmol/L (ref 3.5–5.1)
Sodium: 141 mmol/L (ref 136–145)

## 2011-09-04 LAB — CBC WITH DIFFERENTIAL/PLATELET
HCT: 29.4 % — ABNORMAL LOW (ref 35.0–47.0)
Lymphocyte #: 1.1 10*3/uL (ref 1.0–3.6)
Lymphocyte %: 12.4 %
Monocyte #: 1 x10 3/mm — ABNORMAL HIGH (ref 0.2–0.9)
Monocyte %: 11.3 %
Neutrophil %: 72.6 %
RDW: 14.2 % (ref 11.5–14.5)
WBC: 8.6 10*3/uL (ref 3.6–11.0)

## 2011-09-04 LAB — TROPONIN I: Troponin-I: <0.02 ng/mL

## 2011-09-04 LAB — CK TOTAL AND CKMB (NOT AT ARMC)
CK, Total: 16 U/L — ABNORMAL LOW (ref 21–215)
CK, Total: <7 U/L — ABNORMAL LOW (ref 21–215)
CK-MB: 0.5 ng/mL (ref 0.5–3.6)
CK-MB: <0.5 ng/mL — ABNORMAL LOW (ref 0.5–3.6)

## 2011-09-08 LAB — CBC CANCER CENTER
Eosinophil #: 0.1 x10 3/mm (ref 0.0–0.7)
Eosinophil %: 1.3 %
Lymphocyte #: 1.4 x10 3/mm (ref 1.0–3.6)
MCH: 28.4 pg (ref 26.0–34.0)
MCV: 87 fL (ref 80–100)
Monocyte #: 1.5 x10 3/mm — ABNORMAL HIGH (ref 0.2–0.9)
Neutrophil #: 7.1 x10 3/mm — ABNORMAL HIGH (ref 1.4–6.5)
Platelet: 795 x10 3/mm — ABNORMAL HIGH (ref 150–440)
RDW: 14.3 % (ref 11.5–14.5)

## 2011-09-08 LAB — CULTURE, BLOOD (SINGLE)

## 2011-09-11 LAB — COMPREHENSIVE METABOLIC PANEL
Albumin: 2.3 g/dL — ABNORMAL LOW (ref 3.4–5.0)
Alkaline Phosphatase: 377 U/L — ABNORMAL HIGH (ref 50–136)
Anion Gap: 7 (ref 7–16)
BUN: 5 mg/dL — ABNORMAL LOW (ref 7–18)
Bilirubin,Total: 0.3 mg/dL (ref 0.2–1.0)
Chloride: 99 mmol/L (ref 98–107)
Creatinine: 0.55 mg/dL — ABNORMAL LOW (ref 0.60–1.30)
SGPT (ALT): 12 U/L (ref 12–78)
Sodium: 137 mmol/L (ref 136–145)
Total Protein: 7.6 g/dL (ref 6.4–8.2)

## 2011-09-15 LAB — CBC CANCER CENTER
Basophil %: 0.5 %
Eosinophil #: 0.1 x10 3/mm (ref 0.0–0.7)
HCT: 33.4 % — ABNORMAL LOW (ref 35.0–47.0)
HGB: 11 g/dL — ABNORMAL LOW (ref 12.0–16.0)
Lymphocyte #: 0.9 x10 3/mm — ABNORMAL LOW (ref 1.0–3.6)
Lymphocyte %: 7.3 %
MCV: 87 fL (ref 80–100)
Monocyte #: 0.6 x10 3/mm (ref 0.2–0.9)
Monocyte %: 4.9 %
Neutrophil #: 10.2 x10 3/mm — ABNORMAL HIGH (ref 1.4–6.5)
RBC: 3.83 10*6/uL (ref 3.80–5.20)
RDW: 14.6 % — ABNORMAL HIGH (ref 11.5–14.5)
WBC: 11.8 x10 3/mm — ABNORMAL HIGH (ref 3.6–11.0)

## 2011-09-16 LAB — URINALYSIS, COMPLETE
Bilirubin,UR: NEGATIVE
Blood: NEGATIVE
Glucose,UR: NEGATIVE mg/dL (ref 0–75)
Nitrite: NEGATIVE
Ph: 5 (ref 4.5–8.0)
Protein: NEGATIVE
WBC UR: 11 /HPF (ref 0–5)

## 2011-09-18 LAB — CBC CANCER CENTER
Eosinophil #: 0.1 x10 3/mm (ref 0.0–0.7)
Eosinophil %: 1.3 %
Lymphocyte #: 0.8 x10 3/mm — ABNORMAL LOW (ref 1.0–3.6)
Lymphocyte %: 11.3 %
Monocyte #: 1 x10 3/mm — ABNORMAL HIGH (ref 0.2–0.9)
Monocyte %: 13.6 %
Neutrophil %: 72.6 %
Platelet: 544 x10 3/mm — ABNORMAL HIGH (ref 150–440)
RBC: 3.62 10*6/uL — ABNORMAL LOW (ref 3.80–5.20)
RDW: 14.4 % (ref 11.5–14.5)
WBC: 7.1 x10 3/mm (ref 3.6–11.0)

## 2011-09-18 LAB — COMPREHENSIVE METABOLIC PANEL
Alkaline Phosphatase: 466 U/L — ABNORMAL HIGH (ref 50–136)
Anion Gap: 7 (ref 7–16)
BUN: 3 mg/dL — ABNORMAL LOW (ref 7–18)
Bilirubin,Total: 0.3 mg/dL (ref 0.2–1.0)
Calcium, Total: 8.6 mg/dL (ref 8.5–10.1)
Co2: 32 mmol/L (ref 21–32)
Sodium: 139 mmol/L (ref 136–145)

## 2011-09-18 LAB — URINE CULTURE

## 2011-09-20 ENCOUNTER — Ambulatory Visit: Payer: Self-pay | Admitting: Oncology

## 2011-09-22 LAB — COMPREHENSIVE METABOLIC PANEL
Albumin: 2.6 g/dL — ABNORMAL LOW (ref 3.4–5.0)
Anion Gap: 9 (ref 7–16)
BUN: 7 mg/dL (ref 7–18)
Bilirubin,Total: 0.6 mg/dL (ref 0.2–1.0)
Calcium, Total: 9 mg/dL (ref 8.5–10.1)
Co2: 31 mmol/L (ref 21–32)
Creatinine: 0.65 mg/dL (ref 0.60–1.30)
EGFR (African American): >60
Glucose: 114 mg/dL — ABNORMAL HIGH (ref 65–99)
Osmolality: 273 (ref 275–301)
Potassium: 3.3 mmol/L — ABNORMAL LOW (ref 3.5–5.1)
SGPT (ALT): 27 U/L (ref 12–78)
Sodium: 137 mmol/L (ref 136–145)

## 2011-09-22 LAB — CBC CANCER CENTER
Basophil #: 0.1 x10 3/mm (ref 0.0–0.1)
Eosinophil %: 0.6 %
Lymphocyte %: 11.1 %
MCHC: 32.1 g/dL (ref 32.0–36.0)
MCV: 87 fL (ref 80–100)
Monocyte #: 0.4 x10 3/mm (ref 0.2–0.9)
Monocyte %: 4.6 %
Platelet: 443 x10 3/mm — ABNORMAL HIGH (ref 150–440)
RDW: 14.6 % — ABNORMAL HIGH (ref 11.5–14.5)
WBC: 8.4 x10 3/mm (ref 3.6–11.0)

## 2011-09-25 LAB — COMPREHENSIVE METABOLIC PANEL
Alkaline Phosphatase: 399 U/L — ABNORMAL HIGH (ref 50–136)
Anion Gap: 7 (ref 7–16)
Bilirubin,Total: 0.2 mg/dL (ref 0.2–1.0)
Calcium, Total: 9.1 mg/dL (ref 8.5–10.1)
Chloride: 98 mmol/L (ref 98–107)
Co2: 31 mmol/L (ref 21–32)
Creatinine: 0.69 mg/dL (ref 0.60–1.30)
EGFR (African American): >60
EGFR (Non-African Amer.): >60
Osmolality: 272 (ref 275–301)
Potassium: 3.7 mmol/L (ref 3.5–5.1)
SGPT (ALT): 50 U/L (ref 12–78)
Sodium: 136 mmol/L (ref 136–145)
Total Protein: 7.6 g/dL (ref 6.4–8.2)

## 2011-09-25 LAB — CBC CANCER CENTER
Basophil #: 0 x10 3/mm (ref 0.0–0.1)
Eosinophil #: 0 x10 3/mm (ref 0.0–0.7)
Eosinophil %: 0.1 %
Lymphocyte %: 4.5 %
MCH: 27.9 pg (ref 26.0–34.0)
MCHC: 32.1 g/dL (ref 32.0–36.0)
Monocyte #: 0.6 x10 3/mm (ref 0.2–0.9)
Neutrophil %: 87 %
Platelet: 396 x10 3/mm (ref 150–440)
RBC: 3.88 10*6/uL (ref 3.80–5.20)
RDW: 14.7 % — ABNORMAL HIGH (ref 11.5–14.5)
WBC: 7.1 x10 3/mm (ref 3.6–11.0)

## 2011-09-25 LAB — MAGNESIUM: Magnesium: 2.1 mg/dL

## 2011-10-03 ENCOUNTER — Emergency Department: Payer: Self-pay | Admitting: Emergency Medicine

## 2011-10-03 LAB — CBC
HCT: 33 % — ABNORMAL LOW (ref 35.0–47.0)
HGB: 10.8 g/dL — ABNORMAL LOW (ref 12.0–16.0)
MCH: 28.5 pg (ref 26.0–34.0)
MCHC: 32.6 g/dL (ref 32.0–36.0)
MCV: 87 fL (ref 80–100)
Platelet: 377 10*3/uL (ref 150–440)
RDW: 15.2 % — ABNORMAL HIGH (ref 11.5–14.5)

## 2011-10-03 LAB — URINALYSIS, COMPLETE
Blood: NEGATIVE
Ketone: NEGATIVE
Nitrite: NEGATIVE
Protein: NEGATIVE
RBC,UR: 1 /HPF (ref 0–5)
Squamous Epithelial: 4
WBC UR: 6 /HPF (ref 0–5)

## 2011-10-03 LAB — COMPREHENSIVE METABOLIC PANEL
Albumin: 2.9 g/dL — ABNORMAL LOW (ref 3.4–5.0)
Anion Gap: 7 (ref 7–16)
Chloride: 104 mmol/L (ref 98–107)
Co2: 29 mmol/L (ref 21–32)
EGFR (African American): >60
EGFR (Non-African Amer.): >60
Glucose: 111 mg/dL — ABNORMAL HIGH (ref 65–99)
Potassium: 3.5 mmol/L (ref 3.5–5.1)
SGOT(AST): 28 U/L (ref 15–37)
SGPT (ALT): 52 U/L (ref 12–78)
Total Protein: 6.9 g/dL (ref 6.4–8.2)

## 2011-10-07 ENCOUNTER — Inpatient Hospital Stay: Payer: Self-pay | Admitting: Internal Medicine

## 2011-10-07 LAB — URINALYSIS, COMPLETE
Bilirubin,UR: NEGATIVE
Blood: NEGATIVE
Glucose,UR: NEGATIVE mg/dL (ref 0–75)
Specific Gravity: 1.009 (ref 1.003–1.030)
Squamous Epithelial: 3
WBC UR: 39 /HPF (ref 0–5)

## 2011-10-07 LAB — COMPREHENSIVE METABOLIC PANEL
Alkaline Phosphatase: 270 U/L — ABNORMAL HIGH (ref 50–136)
Anion Gap: 8 (ref 7–16)
BUN: 3 mg/dL — ABNORMAL LOW (ref 7–18)
Bilirubin,Total: 0.3 mg/dL (ref 0.2–1.0)
Chloride: 104 mmol/L (ref 98–107)
Co2: 26 mmol/L (ref 21–32)
Creatinine: 0.4 mg/dL — ABNORMAL LOW (ref 0.60–1.30)
EGFR (African American): >60
Osmolality: 271 (ref 275–301)
SGPT (ALT): 48 U/L (ref 12–78)
Sodium: 138 mmol/L (ref 136–145)
Total Protein: 6.7 g/dL (ref 6.4–8.2)

## 2011-10-07 LAB — PROTIME-INR: INR: 0.9

## 2011-10-07 LAB — CBC
HCT: 33.5 % — ABNORMAL LOW (ref 35.0–47.0)
MCH: 28.8 pg (ref 26.0–34.0)
MCV: 88 fL (ref 80–100)
Platelet: 332 10*3/uL (ref 150–440)
RBC: 3.82 10*6/uL (ref 3.80–5.20)
RDW: 16.4 % — ABNORMAL HIGH (ref 11.5–14.5)

## 2011-10-07 LAB — TROPONIN I: Troponin-I: <0.02 ng/mL

## 2011-10-08 LAB — BASIC METABOLIC PANEL
Calcium, Total: 8.5 mg/dL (ref 8.5–10.1)
Co2: 28 mmol/L (ref 21–32)
EGFR (African American): >60
EGFR (Non-African Amer.): >60
Glucose: 88 mg/dL (ref 65–99)
Osmolality: 276 (ref 275–301)
Potassium: 2.9 mmol/L — ABNORMAL LOW (ref 3.5–5.1)
Sodium: 140 mmol/L (ref 136–145)

## 2011-10-08 LAB — CBC WITH DIFFERENTIAL/PLATELET
Basophil #: 0.1 10*3/uL (ref 0.0–0.1)
Basophil %: 1.3 %
Eosinophil #: 0.2 10*3/uL (ref 0.0–0.7)
Eosinophil %: 3.5 %
HCT: 31.3 % — ABNORMAL LOW (ref 35.0–47.0)
MCH: 28.6 pg (ref 26.0–34.0)
MCHC: 32.5 g/dL (ref 32.0–36.0)
MCV: 88 fL (ref 80–100)
Neutrophil #: 3.6 10*3/uL (ref 1.4–6.5)
Neutrophil %: 64.2 %
RDW: 16.4 % — ABNORMAL HIGH (ref 11.5–14.5)

## 2011-10-08 LAB — LIPID PANEL
HDL Cholesterol: 45 mg/dL (ref 40–60)
Ldl Cholesterol, Calc: 127 mg/dL — ABNORMAL HIGH (ref 0–100)

## 2011-10-08 LAB — PROTIME-INR
INR: 1
Prothrombin Time: 13.4 secs (ref 11.5–14.7)

## 2011-10-08 LAB — MAGNESIUM: Magnesium: 1.7 mg/dL — ABNORMAL LOW

## 2011-10-09 LAB — BASIC METABOLIC PANEL
Anion Gap: 8 (ref 7–16)
Calcium, Total: 9.3 mg/dL (ref 8.5–10.1)
Co2: 29 mmol/L (ref 21–32)
Creatinine: 0.54 mg/dL — ABNORMAL LOW (ref 0.60–1.30)
EGFR (African American): >60
EGFR (Non-African Amer.): >60
Glucose: 90 mg/dL (ref 65–99)

## 2011-10-09 LAB — PROTIME-INR: INR: 1

## 2011-10-09 LAB — MAGNESIUM: Magnesium: 1.9 mg/dL

## 2011-10-10 LAB — PROTIME-INR
INR: 1.2
Prothrombin Time: 15.7 secs — ABNORMAL HIGH (ref 11.5–14.7)

## 2011-10-10 LAB — BASIC METABOLIC PANEL
Anion Gap: 12 (ref 7–16)
Chloride: 104 mmol/L (ref 98–107)
Co2: 24 mmol/L (ref 21–32)
Osmolality: 277 (ref 275–301)
Sodium: 140 mmol/L (ref 136–145)

## 2011-10-11 LAB — BASIC METABOLIC PANEL
Anion Gap: 11 (ref 7–16)
BUN: 8 mg/dL (ref 7–18)
Calcium, Total: 8.7 mg/dL (ref 8.5–10.1)
Chloride: 106 mmol/L (ref 98–107)
Co2: 24 mmol/L (ref 21–32)
Creatinine: 0.51 mg/dL — ABNORMAL LOW (ref 0.60–1.30)
EGFR (African American): >60
Osmolality: 280 (ref 275–301)
Potassium: 4.7 mmol/L (ref 3.5–5.1)
Sodium: 141 mmol/L (ref 136–145)

## 2011-10-11 LAB — PROTIME-INR
INR: 1.6
Prothrombin Time: 19.5 secs — ABNORMAL HIGH (ref 11.5–14.7)

## 2011-10-12 LAB — HEMOGLOBIN: HGB: 10.6 g/dL — ABNORMAL LOW (ref 12.0–16.0)

## 2011-10-12 LAB — PROTIME-INR: Prothrombin Time: 21.9 secs — ABNORMAL HIGH (ref 11.5–14.7)

## 2011-10-16 LAB — CBC CANCER CENTER
Eosinophil #: 0 x10 3/mm (ref 0.0–0.7)
Eosinophil %: 0.2 %
Lymphocyte #: 1 x10 3/mm (ref 1.0–3.6)
MCH: 29 pg (ref 26.0–34.0)
MCHC: 31.9 g/dL — ABNORMAL LOW (ref 32.0–36.0)
MCV: 91 fL (ref 80–100)
Monocyte #: 0.9 x10 3/mm (ref 0.2–0.9)
Neutrophil %: 75.3 %
Platelet: 358 x10 3/mm (ref 150–440)
RBC: 3.6 10*6/uL — ABNORMAL LOW (ref 3.80–5.20)
RDW: 19.1 % — ABNORMAL HIGH (ref 11.5–14.5)

## 2011-10-16 LAB — COMPREHENSIVE METABOLIC PANEL
Albumin: 3.1 g/dL — ABNORMAL LOW (ref 3.4–5.0)
Alkaline Phosphatase: 434 U/L — ABNORMAL HIGH (ref 50–136)
BUN: 12 mg/dL (ref 7–18)
Bilirubin,Total: 0.1 mg/dL — ABNORMAL LOW (ref 0.2–1.0)
Chloride: 100 mmol/L (ref 98–107)
Co2: 27 mmol/L (ref 21–32)
Creatinine: 0.64 mg/dL (ref 0.60–1.30)
EGFR (African American): >60
Glucose: 111 mg/dL — ABNORMAL HIGH (ref 65–99)
SGOT(AST): 34 U/L (ref 15–37)
SGPT (ALT): 48 U/L (ref 12–78)
Sodium: 136 mmol/L (ref 136–145)
Total Protein: 7.7 g/dL (ref 6.4–8.2)

## 2011-10-16 LAB — MAGNESIUM: Magnesium: 1.8 mg/dL

## 2011-10-20 ENCOUNTER — Ambulatory Visit: Payer: Self-pay | Admitting: Oncology

## 2011-10-21 ENCOUNTER — Ambulatory Visit: Payer: Self-pay | Admitting: Vascular Surgery

## 2011-10-21 LAB — PROTIME-INR
INR: 3.2
Prothrombin Time: 32.8 secs — ABNORMAL HIGH (ref 11.5–14.7)

## 2011-10-22 LAB — URINALYSIS, COMPLETE
Bacteria: NONE SEEN
Blood: NEGATIVE
Nitrite: NEGATIVE
Protein: NEGATIVE
WBC UR: 27 /HPF (ref 0–5)

## 2011-10-22 LAB — PROTIME-INR: Prothrombin Time: 21.4 secs — ABNORMAL HIGH (ref 11.5–14.7)

## 2011-10-23 ENCOUNTER — Ambulatory Visit: Payer: Self-pay | Admitting: Oncology

## 2011-10-26 LAB — COMPREHENSIVE METABOLIC PANEL
Anion Gap: 10 (ref 7–16)
Calcium, Total: 8.7 mg/dL (ref 8.5–10.1)
Chloride: 101 mmol/L (ref 98–107)
Co2: 26 mmol/L (ref 21–32)
EGFR (African American): >60
EGFR (Non-African Amer.): >60
Potassium: 3.5 mmol/L (ref 3.5–5.1)
SGOT(AST): 37 U/L (ref 15–37)
SGPT (ALT): 122 U/L — ABNORMAL HIGH (ref 12–78)

## 2011-10-26 LAB — CBC CANCER CENTER
Eosinophil #: 0 x10 3/mm (ref 0.0–0.7)
Eosinophil %: 0.1 %
HCT: 33.5 % — ABNORMAL LOW (ref 35.0–47.0)
Lymphocyte #: 1 x10 3/mm (ref 1.0–3.6)
Lymphocyte %: 8.5 %
MCHC: 31.3 g/dL — ABNORMAL LOW (ref 32.0–36.0)
MCV: 92 fL (ref 80–100)
Monocyte %: 9.7 %
Neutrophil %: 81.2 %
Platelet: 517 x10 3/mm — ABNORMAL HIGH (ref 150–440)
RBC: 3.64 10*6/uL — ABNORMAL LOW (ref 3.80–5.20)
WBC: 11.5 x10 3/mm — ABNORMAL HIGH (ref 3.6–11.0)

## 2011-10-26 LAB — PROTIME-INR
INR: 1.5
Prothrombin Time: 18.3 secs — ABNORMAL HIGH (ref 11.5–14.7)

## 2011-11-02 LAB — COMPREHENSIVE METABOLIC PANEL
Albumin: 3.1 g/dL — ABNORMAL LOW (ref 3.4–5.0)
Alkaline Phosphatase: 169 U/L — ABNORMAL HIGH (ref 50–136)
BUN: 17 mg/dL (ref 7–18)
Bilirubin,Total: 0.3 mg/dL (ref 0.2–1.0)
Calcium, Total: 8.2 mg/dL — ABNORMAL LOW (ref 8.5–10.1)
Co2: 25 mmol/L (ref 21–32)
Creatinine: 0.54 mg/dL — ABNORMAL LOW (ref 0.60–1.30)
EGFR (Non-African Amer.): >60
Glucose: 152 mg/dL — ABNORMAL HIGH (ref 65–99)
Osmolality: 276 (ref 275–301)
SGPT (ALT): 72 U/L (ref 12–78)
Sodium: 136 mmol/L (ref 136–145)
Total Protein: 6.6 g/dL (ref 6.4–8.2)

## 2011-11-02 LAB — CBC CANCER CENTER
Basophil #: 0 x10 3/mm (ref 0.0–0.1)
Eosinophil %: 0.1 %
HCT: 31.7 % — ABNORMAL LOW (ref 35.0–47.0)
Lymphocyte #: 0.6 x10 3/mm — ABNORMAL LOW (ref 1.0–3.6)
MCV: 93 fL (ref 80–100)
Monocyte %: 4.2 %
Neutrophil #: 8.7 x10 3/mm — ABNORMAL HIGH (ref 1.4–6.5)
RBC: 3.42 10*6/uL — ABNORMAL LOW (ref 3.80–5.20)
RDW: 18.1 % — ABNORMAL HIGH (ref 11.5–14.5)
WBC: 9.8 x10 3/mm (ref 3.6–11.0)

## 2011-11-02 LAB — PROTIME-INR: INR: 0.9

## 2011-11-09 LAB — CBC CANCER CENTER
Basophil #: 0.1 x10 3/mm (ref 0.0–0.1)
Basophil %: 1 %
Eosinophil #: 0 x10 3/mm (ref 0.0–0.7)
Eosinophil %: 0.3 %
HGB: 9.5 g/dL — ABNORMAL LOW (ref 12.0–16.0)
Lymphocyte #: 0.5 x10 3/mm — ABNORMAL LOW (ref 1.0–3.6)
MCH: 30.1 pg (ref 26.0–34.0)
MCHC: 32.4 g/dL (ref 32.0–36.0)
MCV: 93 fL (ref 80–100)
Monocyte #: 0.6 x10 3/mm (ref 0.2–0.9)
Monocyte %: 7.8 %
Neutrophil %: 83.6 %
Platelet: 326 x10 3/mm (ref 150–440)
RBC: 3.16 10*6/uL — ABNORMAL LOW (ref 3.80–5.20)
WBC: 7 x10 3/mm (ref 3.6–11.0)

## 2011-11-09 LAB — COMPREHENSIVE METABOLIC PANEL
Anion Gap: 10 (ref 7–16)
BUN: 19 mg/dL — ABNORMAL HIGH (ref 7–18)
Bilirubin,Total: 0.1 mg/dL — ABNORMAL LOW (ref 0.2–1.0)
Calcium, Total: 8.8 mg/dL (ref 8.5–10.1)
Chloride: 99 mmol/L (ref 98–107)
Co2: 26 mmol/L (ref 21–32)
Creatinine: 0.46 mg/dL — ABNORMAL LOW (ref 0.60–1.30)
EGFR (African American): >60
EGFR (Non-African Amer.): >60
Potassium: 3.5 mmol/L (ref 3.5–5.1)
SGPT (ALT): 89 U/L — ABNORMAL HIGH (ref 12–78)
Sodium: 135 mmol/L — ABNORMAL LOW (ref 136–145)
Total Protein: 6.5 g/dL (ref 6.4–8.2)

## 2011-11-16 LAB — PROTIME-INR
INR: 0.9
Prothrombin Time: 12.7 secs (ref 11.5–14.7)

## 2011-11-20 ENCOUNTER — Ambulatory Visit: Payer: Self-pay | Admitting: Oncology

## 2011-12-07 LAB — COMPREHENSIVE METABOLIC PANEL
Alkaline Phosphatase: 158 U/L — ABNORMAL HIGH (ref 50–136)
Calcium, Total: 8.4 mg/dL — ABNORMAL LOW (ref 8.5–10.1)
Co2: 25 mmol/L (ref 21–32)
EGFR (Non-African Amer.): >60
Osmolality: 273 (ref 275–301)
SGOT(AST): 26 U/L (ref 15–37)
SGPT (ALT): 62 U/L (ref 12–78)

## 2011-12-07 LAB — CBC CANCER CENTER
Basophil %: 1.1 %
Eosinophil #: 0.1 x10 3/mm (ref 0.0–0.7)
Eosinophil %: 0.9 %
HCT: 34 % — ABNORMAL LOW (ref 35.0–47.0)
HGB: 10.9 g/dL — ABNORMAL LOW (ref 12.0–16.0)
Lymphocyte #: 0.6 x10 3/mm — ABNORMAL LOW (ref 1.0–3.6)
Lymphocyte %: 7.2 %
MCH: 31.1 pg (ref 26.0–34.0)
MCHC: 32.1 g/dL (ref 32.0–36.0)
MCV: 97 fL (ref 80–100)
Monocyte #: 0.6 x10 3/mm (ref 0.2–0.9)
Neutrophil #: 7.5 x10 3/mm — ABNORMAL HIGH (ref 1.4–6.5)
Neutrophil %: 83.9 %
WBC: 9 x10 3/mm (ref 3.6–11.0)

## 2011-12-07 LAB — PROTIME-INR: Prothrombin Time: 12.4 secs (ref 11.5–14.7)

## 2011-12-14 LAB — URINALYSIS, COMPLETE
Bilirubin,UR: NEGATIVE
Blood: NEGATIVE
Hyaline Cast: 1
Ketone: NEGATIVE
Nitrite: NEGATIVE
Ph: 5 (ref 4.5–8.0)
Protein: NEGATIVE
Squamous Epithelial: 4
WBC UR: 35 /HPF (ref 0–5)

## 2011-12-14 LAB — CBC CANCER CENTER
HCT: 32.9 % — ABNORMAL LOW (ref 35.0–47.0)
HGB: 10.8 g/dL — ABNORMAL LOW (ref 12.0–16.0)
Lymphocytes: 34 %
MCHC: 32.7 g/dL (ref 32.0–36.0)
Metamyelocyte: 1 %
Myelocyte: 2 %
Platelet: 236 x10 3/mm (ref 150–440)
RDW: 17.1 % — ABNORMAL HIGH (ref 11.5–14.5)
Segmented Neutrophils: 50 %
WBC: 4.3 x10 3/mm (ref 3.6–11.0)

## 2011-12-19 ENCOUNTER — Inpatient Hospital Stay: Payer: Self-pay | Admitting: Internal Medicine

## 2011-12-19 LAB — URINALYSIS, COMPLETE
Bilirubin,UR: NEGATIVE
Blood: NEGATIVE
Glucose,UR: NEGATIVE mg/dL (ref 0–75)
Leukocyte Esterase: NEGATIVE
Nitrite: NEGATIVE
Protein: 30
RBC,UR: 1 /HPF (ref 0–5)
Specific Gravity: 1.027 (ref 1.003–1.030)
WBC UR: 5 /HPF (ref 0–5)

## 2011-12-19 LAB — COMPREHENSIVE METABOLIC PANEL
Albumin: 2.2 g/dL — ABNORMAL LOW (ref 3.4–5.0)
Anion Gap: 6 — ABNORMAL LOW (ref 7–16)
Bilirubin,Total: 0.5 mg/dL (ref 0.2–1.0)
Calcium, Total: 7.9 mg/dL — ABNORMAL LOW (ref 8.5–10.1)
Chloride: 94 mmol/L — ABNORMAL LOW (ref 98–107)
Co2: 31 mmol/L (ref 21–32)
EGFR (African American): >60
EGFR (Non-African Amer.): >60
Glucose: 122 mg/dL — ABNORMAL HIGH (ref 65–99)
Osmolality: 265 (ref 275–301)
Potassium: 2.5 mmol/L — CL (ref 3.5–5.1)
SGOT(AST): 44 U/L — ABNORMAL HIGH (ref 15–37)
Sodium: 131 mmol/L — ABNORMAL LOW (ref 136–145)

## 2011-12-19 LAB — CBC WITH DIFFERENTIAL/PLATELET
Eosinophil %: 0 %
Lymphocyte #: 0.5 10*3/uL — ABNORMAL LOW (ref 1.0–3.6)
Lymphocyte %: 2 %
MCH: 30.5 pg (ref 26.0–34.0)
MCV: 94 fL (ref 80–100)
Monocyte %: 3.9 %
Neutrophil %: 93.9 %
Platelet: 190 10*3/uL (ref 150–440)
RBC: 2.99 10*6/uL — ABNORMAL LOW (ref 3.80–5.20)
WBC: 23.5 10*3/uL — ABNORMAL HIGH (ref 3.6–11.0)

## 2011-12-19 LAB — MAGNESIUM: Magnesium: 1.2 mg/dL — ABNORMAL LOW

## 2011-12-19 LAB — CK TOTAL AND CKMB (NOT AT ARMC): CK-MB: 0.5 ng/mL (ref 0.5–3.6)

## 2011-12-20 ENCOUNTER — Ambulatory Visit: Payer: Self-pay | Admitting: Oncology

## 2011-12-20 LAB — CBC WITH DIFFERENTIAL/PLATELET
Basophil #: 0 10*3/uL (ref 0.0–0.1)
HGB: 9.3 g/dL — ABNORMAL LOW (ref 12.0–16.0)
Lymphocyte #: 0.4 10*3/uL — ABNORMAL LOW (ref 1.0–3.6)
Lymphocyte %: 1.6 %
MCH: 32.4 pg (ref 26.0–34.0)
MCV: 95 fL (ref 80–100)
Monocyte %: 3.4 %
Neutrophil #: 22 10*3/uL — ABNORMAL HIGH (ref 1.4–6.5)
Neutrophil %: 94.7 %
Platelet: 219 10*3/uL (ref 150–440)
RDW: 17.8 % — ABNORMAL HIGH (ref 11.5–14.5)

## 2011-12-20 LAB — BASIC METABOLIC PANEL
Anion Gap: 11 (ref 7–16)
BUN: 9 mg/dL (ref 7–18)
Calcium, Total: 7.6 mg/dL — ABNORMAL LOW (ref 8.5–10.1)
Chloride: 113 mmol/L — ABNORMAL HIGH (ref 98–107)
EGFR (African American): >60
EGFR (Non-African Amer.): >60
Glucose: 142 mg/dL — ABNORMAL HIGH (ref 65–99)
Osmolality: 295 (ref 275–301)
Potassium: 3.2 mmol/L — ABNORMAL LOW (ref 3.5–5.1)

## 2011-12-20 LAB — POTASSIUM: Potassium: 3.8 mmol/L (ref 3.5–5.1)

## 2011-12-20 LAB — CK TOTAL AND CKMB (NOT AT ARMC): CK, Total: 40 U/L (ref 21–215)

## 2011-12-21 LAB — BASIC METABOLIC PANEL
Calcium, Total: 8.3 mg/dL — ABNORMAL LOW (ref 8.5–10.1)
Chloride: 106 mmol/L (ref 98–107)
Creatinine: 0.44 mg/dL — ABNORMAL LOW (ref 0.60–1.30)
EGFR (African American): >60
EGFR (Non-African Amer.): >60
Glucose: 85 mg/dL (ref 65–99)
Potassium: 4.2 mmol/L (ref 3.5–5.1)
Sodium: 139 mmol/L (ref 136–145)

## 2011-12-21 LAB — CBC WITH DIFFERENTIAL/PLATELET
HGB: 7.7 g/dL — ABNORMAL LOW (ref 12.0–16.0)
MCH: 31.1 pg (ref 26.0–34.0)
MCV: 95 fL (ref 80–100)
Metamyelocyte: 2 %
Monocytes: 2 %
Myelocyte: 1 %
Platelet: 168 10*3/uL (ref 150–440)
RBC: 2.49 10*6/uL — ABNORMAL LOW (ref 3.80–5.20)
RDW: 17.4 % — ABNORMAL HIGH (ref 11.5–14.5)
Variant Lymphocyte - H1-Rlymph: 0 %

## 2011-12-21 LAB — URINE CULTURE

## 2011-12-21 LAB — VANCOMYCIN, TROUGH: Vancomycin, Trough: 10 ug/mL (ref 10–20)

## 2011-12-22 LAB — VANCOMYCIN, TROUGH: Vancomycin, Trough: 13 ug/mL (ref 10–20)

## 2011-12-22 LAB — CBC WITH DIFFERENTIAL/PLATELET
Basophil %: 0.3 %
HCT: 24.9 % — ABNORMAL LOW (ref 35.0–47.0)
HGB: 8.3 g/dL — ABNORMAL LOW (ref 12.0–16.0)
Lymphocyte %: 3.2 %
Monocyte %: 7 %
Neutrophil #: 12.6 10*3/uL — ABNORMAL HIGH (ref 1.4–6.5)
Neutrophil %: 89.3 %
RBC: 2.63 10*6/uL — ABNORMAL LOW (ref 3.80–5.20)
WBC: 14.1 10*3/uL — ABNORMAL HIGH (ref 3.6–11.0)

## 2011-12-22 LAB — BASIC METABOLIC PANEL
BUN: 4 mg/dL — ABNORMAL LOW (ref 7–18)
Calcium, Total: 8.8 mg/dL (ref 8.5–10.1)
Chloride: 102 mmol/L (ref 98–107)
Creatinine: 0.42 mg/dL — ABNORMAL LOW (ref 0.60–1.30)
EGFR (African American): >60
EGFR (Non-African Amer.): >60
Potassium: 4.1 mmol/L (ref 3.5–5.1)
Sodium: 136 mmol/L (ref 136–145)

## 2011-12-23 LAB — BASIC METABOLIC PANEL
Anion Gap: 7 (ref 7–16)
Calcium, Total: 8.4 mg/dL — ABNORMAL LOW (ref 8.5–10.1)
Chloride: 104 mmol/L (ref 98–107)
Co2: 27 mmol/L (ref 21–32)
EGFR (Non-African Amer.): >60
Glucose: 111 mg/dL — ABNORMAL HIGH (ref 65–99)
Osmolality: 273 (ref 275–301)
Potassium: 3.8 mmol/L (ref 3.5–5.1)
Sodium: 138 mmol/L (ref 136–145)

## 2011-12-23 LAB — CBC WITH DIFFERENTIAL/PLATELET
HGB: 8 g/dL — ABNORMAL LOW (ref 12.0–16.0)
Lymphocytes: 4 %
MCH: 31.8 pg (ref 26.0–34.0)
MCHC: 33.4 g/dL (ref 32.0–36.0)
MCV: 95 fL (ref 80–100)
Monocytes: 4 %
Platelet: 206 10*3/uL (ref 150–440)
RBC: 2.5 10*6/uL — ABNORMAL LOW (ref 3.80–5.20)
Segmented Neutrophils: 85 %

## 2011-12-23 LAB — PROTIME-INR: Prothrombin Time: 14.2 secs (ref 11.5–14.7)

## 2011-12-24 LAB — CBC WITH DIFFERENTIAL/PLATELET
Basophil #: 0 10*3/uL (ref 0.0–0.1)
Basophil %: 0.5 %
Eosinophil #: 0 10*3/uL (ref 0.0–0.7)
Eosinophil %: 0.4 %
HCT: 22.8 % — ABNORMAL LOW (ref 35.0–47.0)
HGB: 7.8 g/dL — ABNORMAL LOW (ref 12.0–16.0)
Lymphocyte #: 0.5 10*3/uL — ABNORMAL LOW (ref 1.0–3.6)
MCV: 95 fL (ref 80–100)
Monocyte #: 1.2 x10 3/mm — ABNORMAL HIGH (ref 0.2–0.9)
Neutrophil #: 7.9 10*3/uL — ABNORMAL HIGH (ref 1.4–6.5)
Platelet: 221 10*3/uL (ref 150–440)
RBC: 2.39 10*6/uL — ABNORMAL LOW (ref 3.80–5.20)
WBC: 9.6 10*3/uL (ref 3.6–11.0)

## 2011-12-24 LAB — PROTIME-INR: Prothrombin Time: 17.4 secs — ABNORMAL HIGH (ref 11.5–14.7)

## 2011-12-24 LAB — BASIC METABOLIC PANEL
Anion Gap: 5 — ABNORMAL LOW (ref 7–16)
Calcium, Total: 8.2 mg/dL — ABNORMAL LOW (ref 8.5–10.1)
Co2: 28 mmol/L (ref 21–32)
Creatinine: 0.85 mg/dL (ref 0.60–1.30)
EGFR (African American): >60
EGFR (Non-African Amer.): >60
Glucose: 107 mg/dL — ABNORMAL HIGH (ref 65–99)
Potassium: 3.9 mmol/L (ref 3.5–5.1)
Sodium: 137 mmol/L (ref 136–145)

## 2011-12-24 LAB — MAGNESIUM: Magnesium: 2.4 mg/dL

## 2011-12-25 LAB — CULTURE, BLOOD (SINGLE)

## 2011-12-28 ENCOUNTER — Ambulatory Visit: Payer: Self-pay | Admitting: Oncology

## 2011-12-28 LAB — COMPREHENSIVE METABOLIC PANEL
Albumin: 2.2 g/dL — ABNORMAL LOW (ref 3.4–5.0)
Anion Gap: 9 (ref 7–16)
BUN: 11 mg/dL (ref 7–18)
Calcium, Total: 8.4 mg/dL — ABNORMAL LOW (ref 8.5–10.1)
Co2: 29 mmol/L (ref 21–32)
EGFR (Non-African Amer.): >60
Glucose: 135 mg/dL — ABNORMAL HIGH (ref 65–99)
Osmolality: 283 (ref 275–301)
Potassium: 3.2 mmol/L — ABNORMAL LOW (ref 3.5–5.1)
SGOT(AST): 24 U/L (ref 15–37)
Sodium: 141 mmol/L (ref 136–145)
Total Protein: 6.4 g/dL (ref 6.4–8.2)

## 2011-12-28 LAB — CBC CANCER CENTER
Basophil #: 0.1 x10 3/mm (ref 0.0–0.1)
Eosinophil #: 0 x10 3/mm (ref 0.0–0.7)
HCT: 27.1 % — ABNORMAL LOW (ref 35.0–47.0)
Lymphocyte #: 0.5 x10 3/mm — ABNORMAL LOW (ref 1.0–3.6)
Lymphocyte %: 3.3 %
MCH: 31.6 pg (ref 26.0–34.0)
MCHC: 33.1 g/dL (ref 32.0–36.0)
Monocyte %: 8.5 %
Neutrophil #: 12.3 x10 3/mm — ABNORMAL HIGH (ref 1.4–6.5)
Platelet: 579 x10 3/mm — ABNORMAL HIGH (ref 150–440)
RDW: 17.3 % — ABNORMAL HIGH (ref 11.5–14.5)
WBC: 14 x10 3/mm — ABNORMAL HIGH (ref 3.6–11.0)

## 2012-01-04 LAB — COMPREHENSIVE METABOLIC PANEL
Albumin: 2.7 g/dL — ABNORMAL LOW (ref 3.4–5.0)
Alkaline Phosphatase: 256 U/L — ABNORMAL HIGH (ref 50–136)
Bilirubin,Total: 0.1 mg/dL — ABNORMAL LOW (ref 0.2–1.0)
Calcium, Total: 8.1 mg/dL — ABNORMAL LOW (ref 8.5–10.1)
Chloride: 96 mmol/L — ABNORMAL LOW (ref 98–107)
Co2: 28 mmol/L (ref 21–32)
Creatinine: 0.94 mg/dL (ref 0.60–1.30)
EGFR (Non-African Amer.): >60
Glucose: 206 mg/dL — ABNORMAL HIGH (ref 65–99)
Osmolality: 280 (ref 275–301)
SGOT(AST): 21 U/L (ref 15–37)
SGPT (ALT): 38 U/L (ref 12–78)
Sodium: 135 mmol/L — ABNORMAL LOW (ref 136–145)
Total Protein: 6.4 g/dL (ref 6.4–8.2)

## 2012-01-04 LAB — CBC CANCER CENTER
Basophil #: 0.1 x10 3/mm (ref 0.0–0.1)
Eosinophil #: 0 x10 3/mm (ref 0.0–0.7)
Eosinophil %: 0.1 %
Lymphocyte #: 1.3 x10 3/mm (ref 1.0–3.6)
Lymphocyte %: 6.2 %
MCH: 31 pg (ref 26.0–34.0)
MCV: 94 fL (ref 80–100)
Monocyte #: 2 x10 3/mm — ABNORMAL HIGH (ref 0.2–0.9)
Monocyte %: 9.6 %
Neutrophil %: 83.8 %
Platelet: 692 x10 3/mm — ABNORMAL HIGH (ref 150–440)
RBC: 3.16 10*6/uL — ABNORMAL LOW (ref 3.80–5.20)
RDW: 16.8 % — ABNORMAL HIGH (ref 11.5–14.5)
WBC: 21.2 x10 3/mm — ABNORMAL HIGH (ref 3.6–11.0)

## 2012-01-04 LAB — PROTIME-INR: Prothrombin Time: 31 secs — ABNORMAL HIGH (ref 11.5–14.7)

## 2012-01-07 ENCOUNTER — Inpatient Hospital Stay: Payer: Self-pay | Admitting: Internal Medicine

## 2012-01-07 LAB — CBC WITH DIFFERENTIAL/PLATELET
Bands: 2 %
HGB: 9.5 g/dL — ABNORMAL LOW (ref 12.0–16.0)
MCH: 30.1 pg (ref 26.0–34.0)
MCHC: 32.3 g/dL (ref 32.0–36.0)
MCV: 93 fL (ref 80–100)
Monocytes: 7 %
Myelocyte: 2 %
RBC: 3.17 10*6/uL — ABNORMAL LOW (ref 3.80–5.20)
RDW: 17 % — ABNORMAL HIGH (ref 11.5–14.5)
Segmented Neutrophils: 77 %
Variant Lymphocyte - H1-Rlymph: 3 %

## 2012-01-07 LAB — COMPREHENSIVE METABOLIC PANEL
Anion Gap: 5 — ABNORMAL LOW (ref 7–16)
BUN: 15 mg/dL (ref 7–18)
Bilirubin,Total: 0.2 mg/dL (ref 0.2–1.0)
Calcium, Total: 8.1 mg/dL — ABNORMAL LOW (ref 8.5–10.1)
Co2: 31 mmol/L (ref 21–32)
Creatinine: 0.61 mg/dL (ref 0.60–1.30)
EGFR (African American): >60
EGFR (Non-African Amer.): >60
Glucose: 89 mg/dL (ref 65–99)
SGOT(AST): 29 U/L (ref 15–37)
Sodium: 137 mmol/L (ref 136–145)
Total Protein: 6.1 g/dL — ABNORMAL LOW (ref 6.4–8.2)

## 2012-01-07 LAB — URINALYSIS, COMPLETE
Bilirubin,UR: NEGATIVE
Hyaline Cast: 1
Ketone: NEGATIVE
RBC,UR: 6 /HPF (ref 0–5)
Specific Gravity: 1.01 (ref 1.003–1.030)
Squamous Epithelial: NONE SEEN
WBC UR: 2 /HPF (ref 0–5)

## 2012-01-07 LAB — TROPONIN I: Troponin-I: <0.02 ng/mL

## 2012-01-07 LAB — PROTIME-INR: Prothrombin Time: 21.1 secs — ABNORMAL HIGH (ref 11.5–14.7)

## 2012-01-08 LAB — CBC WITH DIFFERENTIAL/PLATELET
Basophil #: 0 10*3/uL (ref 0.0–0.1)
Basophil %: 0.3 %
Eosinophil #: 0.1 10*3/uL (ref 0.0–0.7)
Eosinophil %: 0.8 %
Lymphocyte %: 7.8 %
MCH: 30.5 pg (ref 26.0–34.0)
MCHC: 32.6 g/dL (ref 32.0–36.0)
MCV: 94 fL (ref 80–100)
Monocyte %: 11.6 %
Neutrophil #: 11.1 10*3/uL — ABNORMAL HIGH (ref 1.4–6.5)
Neutrophil %: 79.5 %
Platelet: 442 10*3/uL — ABNORMAL HIGH (ref 150–440)
RDW: 16.9 % — ABNORMAL HIGH (ref 11.5–14.5)

## 2012-01-08 LAB — BASIC METABOLIC PANEL
Calcium, Total: 8.5 mg/dL (ref 8.5–10.1)
Co2: 31 mmol/L (ref 21–32)
EGFR (African American): >60
Glucose: 111 mg/dL — ABNORMAL HIGH (ref 65–99)
Sodium: 139 mmol/L (ref 136–145)

## 2012-01-08 LAB — PROTIME-INR
INR: 1.2
Prothrombin Time: 15.5 secs — ABNORMAL HIGH (ref 11.5–14.7)

## 2012-01-09 LAB — CBC WITH DIFFERENTIAL/PLATELET
Basophil #: 0.1 10*3/uL (ref 0.0–0.1)
Basophil %: 1.1 %
Eosinophil %: 1.3 %
HCT: 27.9 % — ABNORMAL LOW (ref 35.0–47.0)
Lymphocyte %: 8.9 %
MCH: 30.7 pg (ref 26.0–34.0)
MCHC: 32.5 g/dL (ref 32.0–36.0)
MCV: 94 fL (ref 80–100)
Monocyte #: 1.5 x10 3/mm — ABNORMAL HIGH (ref 0.2–0.9)
Neutrophil #: 9.2 10*3/uL — ABNORMAL HIGH (ref 1.4–6.5)
RBC: 2.96 10*6/uL — ABNORMAL LOW (ref 3.80–5.20)
RDW: 16.7 % — ABNORMAL HIGH (ref 11.5–14.5)

## 2012-01-09 LAB — PROTIME-INR
INR: 1.1
Prothrombin Time: 14.6 secs (ref 11.5–14.7)

## 2012-01-09 LAB — BASIC METABOLIC PANEL
Anion Gap: 5 — ABNORMAL LOW (ref 7–16)
BUN: 11 mg/dL (ref 7–18)
Chloride: 104 mmol/L (ref 98–107)
Co2: 33 mmol/L — ABNORMAL HIGH (ref 21–32)
Creatinine: 0.84 mg/dL (ref 0.60–1.30)
EGFR (African American): >60
Osmolality: 284 (ref 275–301)
Sodium: 142 mmol/L (ref 136–145)

## 2012-01-09 LAB — MAGNESIUM: Magnesium: 2 mg/dL

## 2012-01-10 LAB — CBC WITH DIFFERENTIAL/PLATELET
Basophil #: 0.1 10*3/uL (ref 0.0–0.1)
Eosinophil #: 0.2 10*3/uL (ref 0.0–0.7)
Lymphocyte %: 9.8 %
MCH: 31.9 pg (ref 26.0–34.0)
MCHC: 34 g/dL (ref 32.0–36.0)
Monocyte #: 1.5 x10 3/mm — ABNORMAL HIGH (ref 0.2–0.9)
Neutrophil %: 75.1 %
RDW: 16.9 % — ABNORMAL HIGH (ref 11.5–14.5)

## 2012-01-10 LAB — BASIC METABOLIC PANEL
BUN: 7 mg/dL (ref 7–18)
Calcium, Total: 9.3 mg/dL (ref 8.5–10.1)
Chloride: 103 mmol/L (ref 98–107)
Creatinine: 0.7 mg/dL (ref 0.60–1.30)
Glucose: 128 mg/dL — ABNORMAL HIGH (ref 65–99)
Osmolality: 279 (ref 275–301)
Potassium: 3.6 mmol/L (ref 3.5–5.1)
Sodium: 140 mmol/L (ref 136–145)

## 2012-01-10 LAB — PROTIME-INR
INR: 1.3
Prothrombin Time: 16.6 secs — ABNORMAL HIGH (ref 11.5–14.7)

## 2012-01-11 LAB — CBC WITH DIFFERENTIAL/PLATELET
Bands: 3 %
HGB: 9.9 g/dL — ABNORMAL LOW (ref 12.0–16.0)
MCH: 31.5 pg (ref 26.0–34.0)
MCHC: 33.4 g/dL (ref 32.0–36.0)
MCV: 94 fL (ref 80–100)
Platelet: 385 10*3/uL (ref 150–440)
RDW: 16.7 % — ABNORMAL HIGH (ref 11.5–14.5)
Segmented Neutrophils: 72 %

## 2012-01-11 LAB — BASIC METABOLIC PANEL
Anion Gap: 5 — ABNORMAL LOW (ref 7–16)
BUN: 6 mg/dL — ABNORMAL LOW (ref 7–18)
Chloride: 104 mmol/L (ref 98–107)
Creatinine: 0.78 mg/dL (ref 0.60–1.30)
EGFR (African American): >60
Glucose: 100 mg/dL — ABNORMAL HIGH (ref 65–99)
Osmolality: 277 (ref 275–301)
Potassium: 3.8 mmol/L (ref 3.5–5.1)

## 2012-01-12 LAB — CULTURE, BLOOD (SINGLE)

## 2012-01-12 LAB — PROTIME-INR
INR: 3.5
Prothrombin Time: 35.3 secs — ABNORMAL HIGH (ref 11.5–14.7)

## 2012-01-15 ENCOUNTER — Emergency Department: Payer: Self-pay | Admitting: Emergency Medicine

## 2012-01-15 LAB — CBC
HCT: 31.2 % — ABNORMAL LOW (ref 35.0–47.0)
HGB: 9.7 g/dL — ABNORMAL LOW (ref 12.0–16.0)
MCHC: 31.1 g/dL — ABNORMAL LOW (ref 32.0–36.0)
WBC: 15.1 10*3/uL — ABNORMAL HIGH (ref 3.6–11.0)

## 2012-01-15 LAB — TROPONIN I: Troponin-I: <0.02 ng/mL

## 2012-01-15 LAB — BASIC METABOLIC PANEL
Calcium, Total: 9.3 mg/dL (ref 8.5–10.1)
Chloride: 103 mmol/L (ref 98–107)
EGFR (Non-African Amer.): >60
Glucose: 128 mg/dL — ABNORMAL HIGH (ref 65–99)
Osmolality: 273 (ref 275–301)
Potassium: 4.1 mmol/L (ref 3.5–5.1)
Sodium: 136 mmol/L (ref 136–145)

## 2012-01-15 LAB — CK TOTAL AND CKMB (NOT AT ARMC): CK-MB: 0.5 ng/mL (ref 0.5–3.6)

## 2012-01-15 LAB — APTT: Activated PTT: 74.9 secs — ABNORMAL HIGH (ref 23.6–35.9)

## 2012-01-15 LAB — PROTIME-INR: Prothrombin Time: 43.5 secs — ABNORMAL HIGH (ref 11.5–14.7)

## 2012-01-20 ENCOUNTER — Ambulatory Visit: Payer: Self-pay | Admitting: Oncology

## 2012-02-08 ENCOUNTER — Inpatient Hospital Stay: Payer: Self-pay | Admitting: Internal Medicine

## 2012-02-08 LAB — CBC WITH DIFFERENTIAL/PLATELET
Basophil #: 0.1 10*3/uL (ref 0.0–0.1)
Eosinophil %: 0.4 %
HGB: 8.8 g/dL — ABNORMAL LOW (ref 12.0–16.0)
Lymphocyte #: 0.6 10*3/uL — ABNORMAL LOW (ref 1.0–3.6)
MCH: 26.8 pg (ref 26.0–34.0)
MCHC: 30.8 g/dL — ABNORMAL LOW (ref 32.0–36.0)
Neutrophil %: 82.3 %
Platelet: 532 10*3/uL — ABNORMAL HIGH (ref 150–440)
RBC: 3.27 10*6/uL — ABNORMAL LOW (ref 3.80–5.20)
RDW: 17 % — ABNORMAL HIGH (ref 11.5–14.5)

## 2012-02-08 LAB — COMPREHENSIVE METABOLIC PANEL
Anion Gap: 7 (ref 7–16)
Bilirubin,Total: 0.2 mg/dL (ref 0.2–1.0)
Chloride: 101 mmol/L (ref 98–107)
Creatinine: 0.59 mg/dL — ABNORMAL LOW (ref 0.60–1.30)
EGFR (African American): >60
EGFR (Non-African Amer.): >60
Glucose: 82 mg/dL (ref 65–99)
Osmolality: 272 (ref 275–301)
Potassium: 3.3 mmol/L — ABNORMAL LOW (ref 3.5–5.1)
SGOT(AST): 18 U/L (ref 15–37)

## 2012-02-08 LAB — URINALYSIS, COMPLETE
Blood: NEGATIVE
Glucose,UR: NEGATIVE mg/dL (ref 0–75)
Hyaline Cast: 21
Leukocyte Esterase: NEGATIVE
Nitrite: NEGATIVE
RBC,UR: 2 /HPF (ref 0–5)
Specific Gravity: 1.016 (ref 1.003–1.030)

## 2012-02-08 LAB — PROTIME-INR
INR: 1.1
Prothrombin Time: 14.7 secs (ref 11.5–14.7)

## 2012-02-08 LAB — PRO B NATRIURETIC PEPTIDE: B-Type Natriuretic Peptide: 1769 pg/mL — ABNORMAL HIGH (ref 0–125)

## 2012-02-08 LAB — MAGNESIUM: Magnesium: 1.8 mg/dL

## 2012-02-09 LAB — BASIC METABOLIC PANEL
Anion Gap: 7 (ref 7–16)
Chloride: 106 mmol/L (ref 98–107)
Creatinine: 0.52 mg/dL — ABNORMAL LOW (ref 0.60–1.30)
EGFR (African American): >60
Glucose: 85 mg/dL (ref 65–99)
Osmolality: 277 (ref 275–301)
Sodium: 140 mmol/L (ref 136–145)

## 2012-02-09 LAB — CBC WITH DIFFERENTIAL/PLATELET
Basophil #: 0 10*3/uL (ref 0.0–0.1)
Basophil %: 0.5 %
Eosinophil #: 0.1 10*3/uL (ref 0.0–0.7)
HGB: 8.7 g/dL — ABNORMAL LOW (ref 12.0–16.0)
Lymphocyte #: 0.6 10*3/uL — ABNORMAL LOW (ref 1.0–3.6)
Lymphocyte %: 6.5 %
MCHC: 32.9 g/dL (ref 32.0–36.0)
Monocyte %: 13.4 %
Neutrophil %: 78.8 %
Platelet: 526 10*3/uL — ABNORMAL HIGH (ref 150–440)
WBC: 10 10*3/uL (ref 3.6–11.0)

## 2012-02-10 LAB — PROTIME-INR: Prothrombin Time: 15.4 secs — ABNORMAL HIGH (ref 11.5–14.7)

## 2012-02-15 ENCOUNTER — Emergency Department: Payer: Self-pay | Admitting: Unknown Physician Specialty

## 2012-02-15 LAB — CBC CANCER CENTER
Basophil %: 0.1 %
Eosinophil #: 0 x10 3/mm (ref 0.0–0.7)
Eosinophil %: 0 %
HCT: 35.3 % (ref 35.0–47.0)
HGB: 11.3 g/dL — ABNORMAL LOW (ref 12.0–16.0)
Lymphocyte #: 0.5 x10 3/mm — ABNORMAL LOW (ref 1.0–3.6)
Lymphocyte %: 1.9 %
MCH: 27.1 pg (ref 26.0–34.0)
MCHC: 32 g/dL (ref 32.0–36.0)
MCV: 85 fL (ref 80–100)
Monocyte #: 0.4 x10 3/mm (ref 0.2–0.9)
Monocyte %: 1.8 %
Neutrophil #: 23 x10 3/mm — ABNORMAL HIGH (ref 1.4–6.5)
Neutrophil %: 96.2 %
Platelet: 749 x10 3/mm — ABNORMAL HIGH (ref 150–440)

## 2012-02-15 LAB — PROTIME-INR
INR: 2.8
Prothrombin Time: 29.9 secs — ABNORMAL HIGH (ref 11.5–14.7)

## 2012-02-15 LAB — COMPREHENSIVE METABOLIC PANEL
Albumin: 2.9 g/dL — ABNORMAL LOW (ref 3.4–5.0)
Alkaline Phosphatase: 199 U/L — ABNORMAL HIGH (ref 50–136)
Anion Gap: 15 (ref 7–16)
BUN: 15 mg/dL (ref 7–18)
Bilirubin,Total: 0.2 mg/dL (ref 0.2–1.0)
Calcium, Total: 8.4 mg/dL — ABNORMAL LOW (ref 8.5–10.1)
Chloride: 95 mmol/L — ABNORMAL LOW (ref 98–107)
Co2: 22 mmol/L (ref 21–32)
Glucose: 170 mg/dL — ABNORMAL HIGH (ref 65–99)
Osmolality: 269 (ref 275–301)
Potassium: 3.3 mmol/L — ABNORMAL LOW (ref 3.5–5.1)
SGPT (ALT): 14 U/L (ref 12–78)
Sodium: 132 mmol/L — ABNORMAL LOW (ref 136–145)
Total Protein: 7.5 g/dL (ref 6.4–8.2)

## 2012-02-15 LAB — MAGNESIUM: Magnesium: 1.6 mg/dL — ABNORMAL LOW

## 2012-02-15 LAB — TROPONIN I: Troponin-I: <0.02 ng/mL

## 2012-02-15 LAB — APTT: Activated PTT: 71.3 secs — ABNORMAL HIGH (ref 23.6–35.9)

## 2012-02-15 LAB — CK TOTAL AND CKMB (NOT AT ARMC)
CK, Total: 21 U/L (ref 21–215)
CK-MB: 1.1 ng/mL (ref 0.5–3.6)

## 2012-02-20 ENCOUNTER — Ambulatory Visit: Payer: Self-pay | Admitting: Oncology

## 2012-03-07 LAB — CBC CANCER CENTER
Basophil %: 0.5 %
Eosinophil %: 0.1 %
Lymphocyte %: 2.3 %
MCHC: 31.4 g/dL — ABNORMAL LOW (ref 32.0–36.0)
MCV: 81 fL (ref 80–100)
Monocyte %: 4.7 %
Neutrophil %: 92.4 %
RDW: 17.6 % — ABNORMAL HIGH (ref 11.5–14.5)
WBC: 31.9 x10 3/mm — ABNORMAL HIGH (ref 3.6–11.0)

## 2012-03-07 LAB — COMPREHENSIVE METABOLIC PANEL
Albumin: 1.7 g/dL — ABNORMAL LOW (ref 3.4–5.0)
Anion Gap: 10 (ref 7–16)
BUN: 7 mg/dL (ref 7–18)
Bilirubin,Total: 0.3 mg/dL (ref 0.2–1.0)
Chloride: 97 mmol/L — ABNORMAL LOW (ref 98–107)
Co2: 30 mmol/L (ref 21–32)
EGFR (Non-African Amer.): >60
Potassium: 2.8 mmol/L — ABNORMAL LOW (ref 3.5–5.1)
SGPT (ALT): 10 U/L — ABNORMAL LOW (ref 12–78)
Sodium: 137 mmol/L (ref 136–145)

## 2012-03-19 ENCOUNTER — Ambulatory Visit: Payer: Self-pay | Admitting: Oncology

## 2012-03-19 DEATH — deceased

## 2014-01-06 IMAGING — CT CT HEAD WITHOUT CONTRAST
1 of 2 series · 15 of 30 positions shown, 19 images · non-contrast
Comparison: none

REASON FOR EXAM: ALTERED MENTAL STATUS
COMMENTS:

PROCEDURE:     CT  - CT HEAD WITHOUT CONTRAST  - January 08, 2012  [DATE]
RESULT:     Comparison:  None
TECHNIQUE: Multiple axial images from the foramen magnum to the vertex were
obtained without IV contrast.

[Series 2: soft tissue · axial · 0.42mm/px · z∈[+272,+408]mm · 15 of 31 slices shown, 19 images]
[im 2/31  brain]
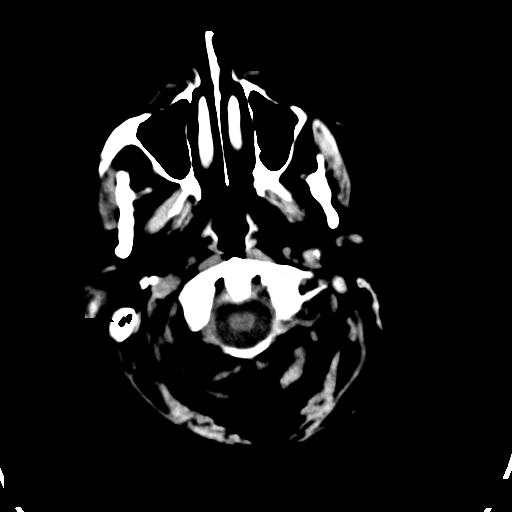
[im 2/31  bone]
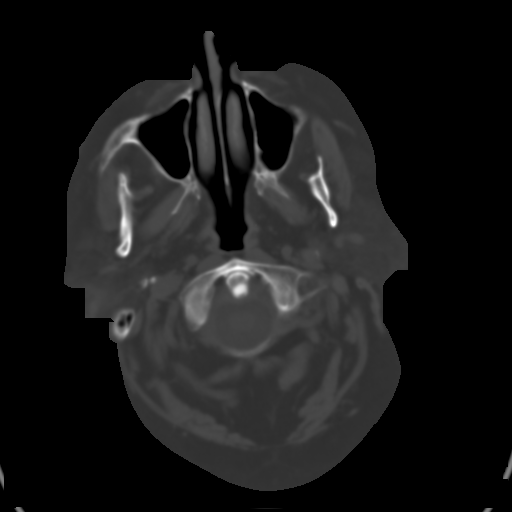
[im 5/31  brain]
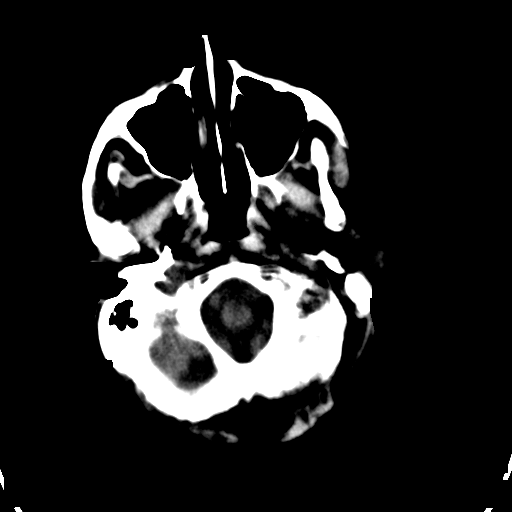
[im 6/31  brain]
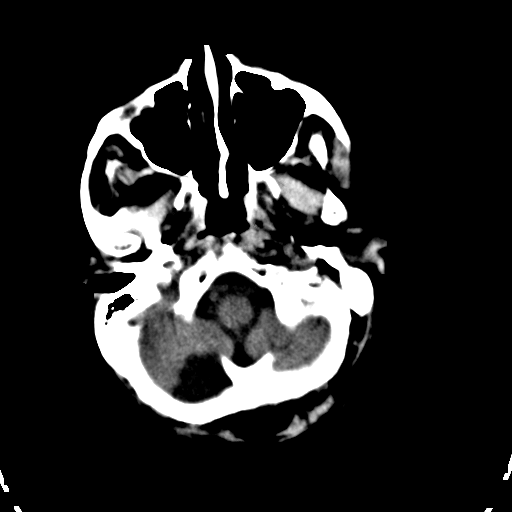
[im 7/31  brain]
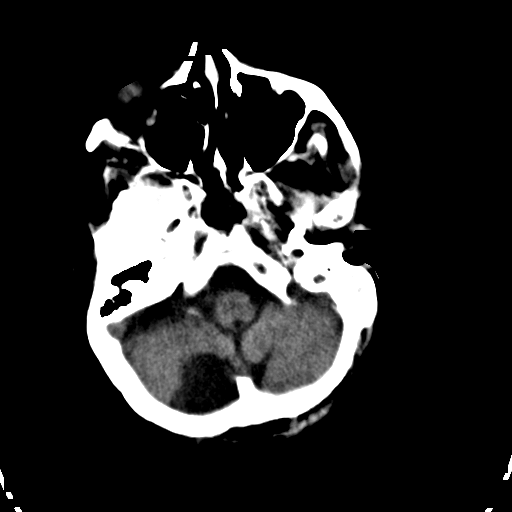
[im 10/31  brain]
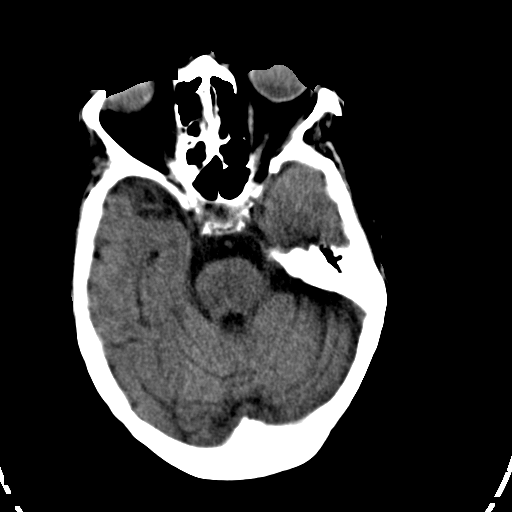
[im 10/31  bone]
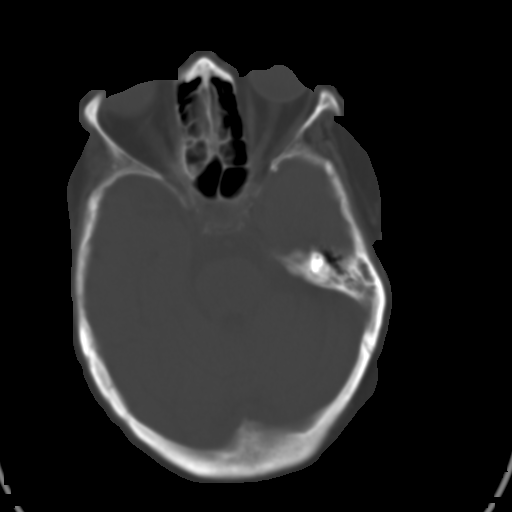
[im 11/31  brain]
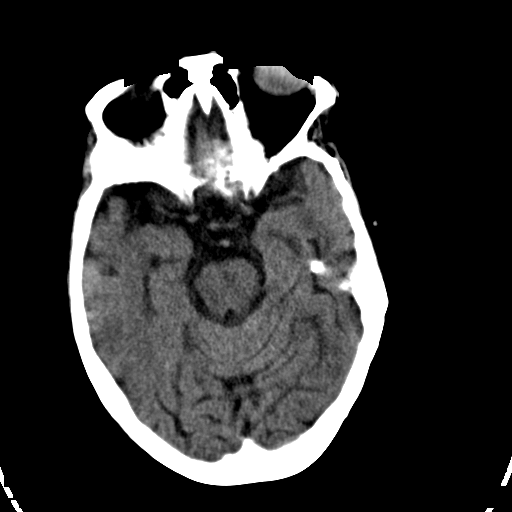
[im 14/31  brain]
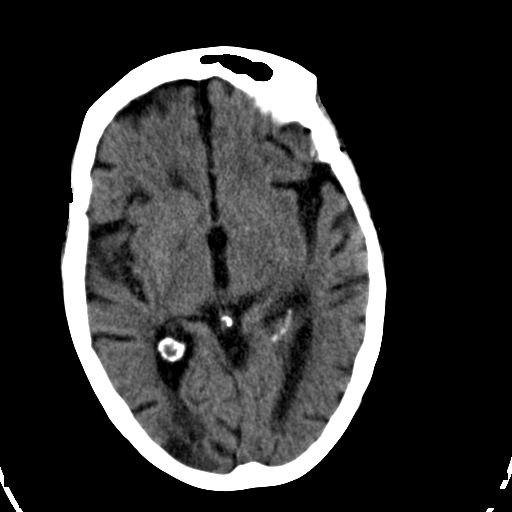
[im 15/31  brain]
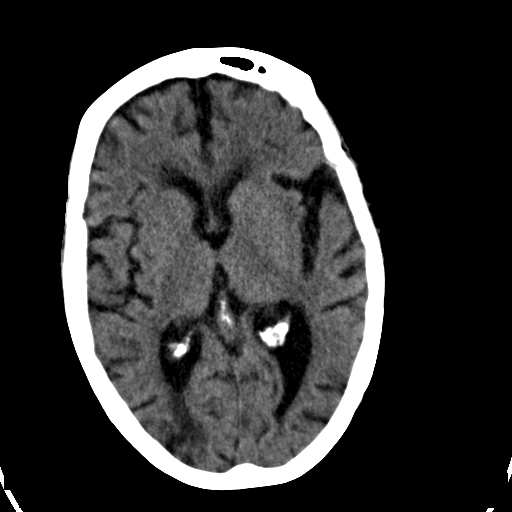
[im 17/31  brain]
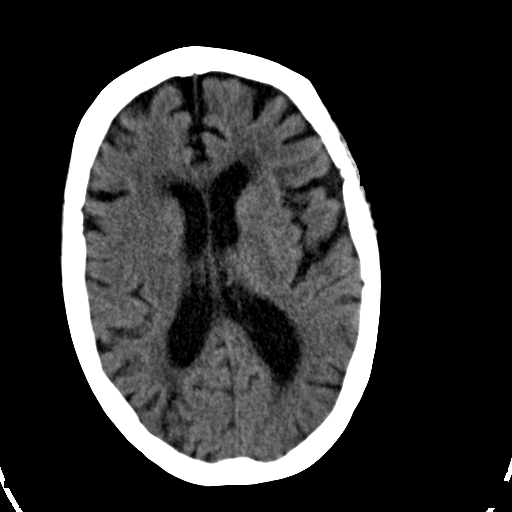
[im 17/31  bone]
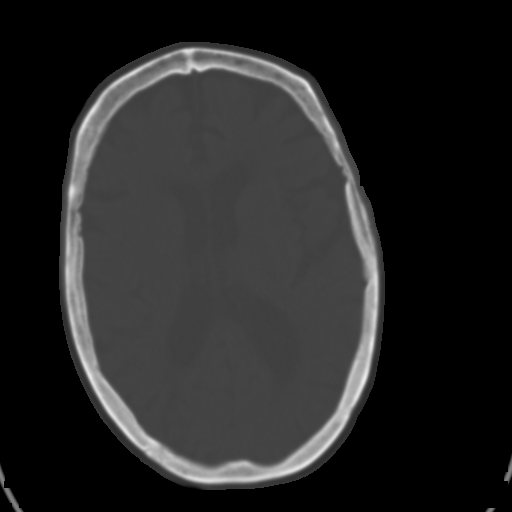
[im 20/31  brain]
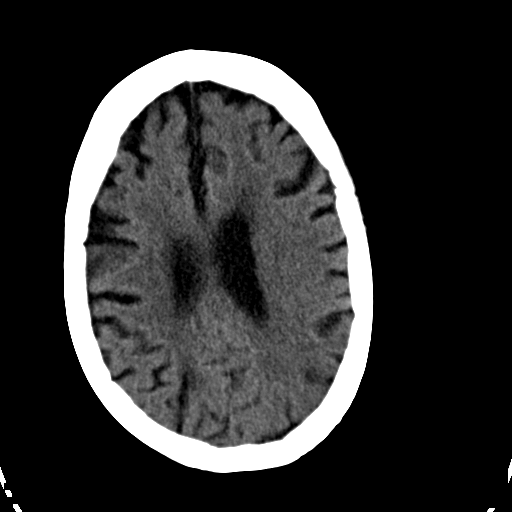
[im 21/31  brain]
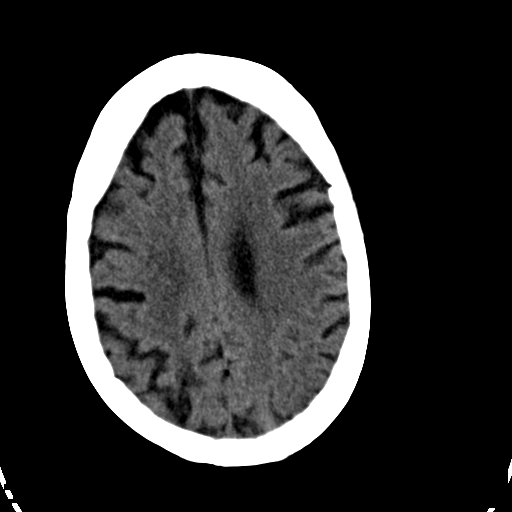
[im 24/31  brain]
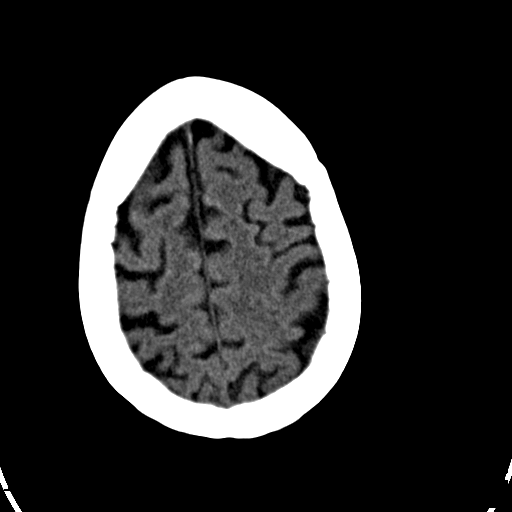
[im 25/31  brain]
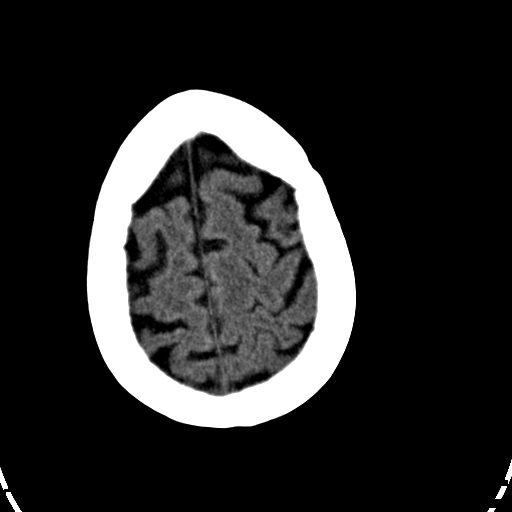
[im 25/31  bone]
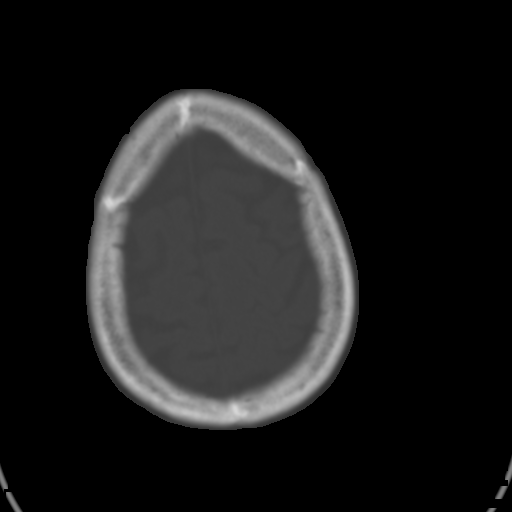
[im 26/31  brain]
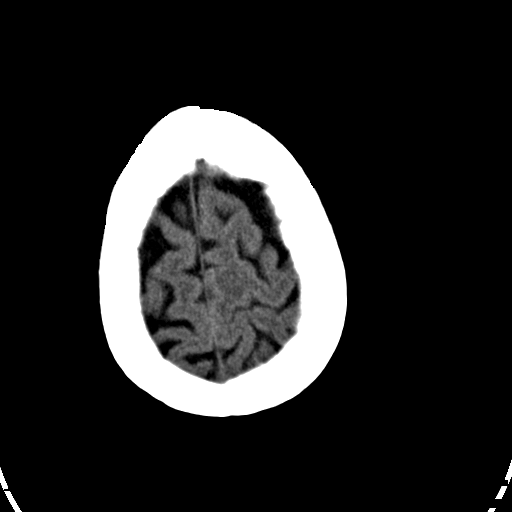
[im 29/31  brain]
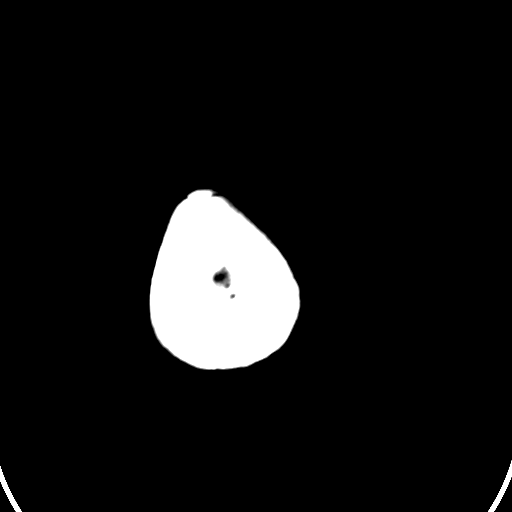

[15 of 30 positions shown; findings below may reference images not displayed]

FINDINGS: There is no evidence of mass effect, midline shift, or extra-axial fluid
collections.  There is no evidence of a space-occupying lesion or
intracranial hemorrhage. There is no evidence of a cortical-based area of
acute infarction. There is a right posterior fossa arachnoid cyst. There is
generalized cerebral atrophy. There is periventricular white matter low
attenuation likely secondary to microangiopathy.

The ventricles and sulci are appropriate for the patient's age. The basal
cisterns are patent.

Visualized portions of the orbits are unremarkable. There is partial
opacification of the left mastoid air cells. Cerebrovascular atherosclerotic
calcifications are noted.

The osseous structures are unremarkable.
IMPRESSION: No acute intracranial process.

Right mastoiditis.

[REDACTED]

## 2014-05-08 NOTE — Consult Note (Signed)
PATIENT NAME:  Stacey Lynch, Stacey Lynch MR#:  [id number] DATE OF BIRTH:  1950-10-18  DATE OF CONSULTATION:  01/08/2012  REFERRING PHYSICIAN:  Marland Mcalpine A. Ellsworth Lennox, MD CONSULTING PHYSICIAN:  Rosalyn Gess. Deandre Brannan, MD  REASON FOR CONSULTATION: Lung cancer with possible pneumonia.   HISTORY OF PRESENT ILLNESS: The patient is a 64 year old female with a past history significant for lung cancer, status post chemotherapy, who was admitted yesterday with shortness of breath, nonproductive cough and weakness. In the Emergency Room, she was found to be tachycardic with leukocytosis and was admitted. She had recently been discharged earlier this month for pneumonia and was treated with Levaquin. She is extremely lethargic and difficult to arouse and was unable to provide any significant history. History is obtained exclusively from the chart.   CURRENT MEDICATIONS: She is currently on azithromycin, Zosyn and vancomycin.   ALLERGIES: INCLUDE AMBIEN.   PAST MEDICAL HISTORY:  1.  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  2.  COPD.  3.  Hypertension.  4.  GERD.  5.  Depression.  6.  DVT.  7.  Atrial fibrillation.   SOCIAL HISTORY: The patient has a prior history of smoking and alcohol abuse, but quit when she was diagnosed with lung cancer. She does not have any injecting drug use history. She lives with her sons.   FAMILY HISTORY: Positive for colon cancer and kidney cancer.   REVIEW OF SYSTEMS: Unable to obtain from the patient due to her current clinical condition.   PHYSICAL EXAMINATION:  VITAL SIGNS: T-max 98.5, T-current 97.7, pulse 86, blood pressure 109/73, 95% on 2 liters.  GENERAL: A 64 year old white female, asleep and difficult to arouse. Upon arousal, she was confused.  HEENT: Normocephalic, atraumatic. Pupils equal and reactive to light. Extraocular motion intact. Sclerae, conjunctivae and lids without evidence for emboli or petechiae. Oropharynx shows no  erythema or exudate. Gums are in fair condition.  NECK: Supple. Full range of motion. Midline trachea. No lymphadenopathy. No thyromegaly.  LUNGS: Clear to auscultation bilaterally with good air movement. No focal consolidation.  HEART: Regular rate and rhythm without murmur, rub or gallop.  ABDOMEN: Soft, nontender, nondistended. No hepatosplenomegaly. No hernias noted.  EXTREMITIES: No evidence for tenosynovitis.  SKIN: No rashes. No stigmata of endocarditis, specifically no Janeway lesions or Osler nodes.  NEUROLOGIC: The patient was lethargic and difficult to arouse. Once aroused, she was minimally to noncommunicative but would follow simple commands.  PSYCHIATRIC: Difficult to assess due to her clinical situation.   LABORATORY DATA: BUN 15, creatinine 0.76, bicarbonate 31, anion gap 7. LFTs from yesterday had an AST of 29, ALT 42, alkaline phosphatase 259, bilirubin of 0.2. White count from today is 14.0 with a hemoglobin of 8.6, platelet count of 442, ANC of 11.1. White count on admission was 19.7. White count from December 16 was 21.2 with a white count on December 9 of 14.0. On December 5, her white count was normal at 9.6, but prior to that at the end of November and early December, her white count was elevated during her prior hospitalization at 23.5 and then came down to 9.6 at the end of her hospitalization. White counts prior to that were normal to slightly increased in the past. A set of blood cultures from admission show no growth to date. Blood cultures from November 30 during her prior hospitalization were negative. A chest x-ray from admission showed atelectasis versus developing pneumonia in the left mid and lower lung. A  repeat chest x-ray from today showed findings consistent with congestive heart failure and pulmonary interstitial edema. There were bilateral pleural effusions. Ultrasound showed a nonocclusive thrombus in the distal left femoral vein of the left leg.   IMPRESSION: A  64 year old female with a history of lung cancer admitted with cough and leukocytosis.   RECOMMENDATIONS:  1.  She is confused and unable to provide any history. She has leukocytosis and cough, but her chest x-ray does not show a definitive infiltrate. She has a nonocclusive DVT, which may not be playing a role in her current situation.  2.  I do not think that she has hospital-acquired pneumonia given her chest x-ray findings, but I do not have another explanation for her leukocytosis. She does not seem to be able to produce a sputum sample.  3.  We will consider CT scan of the chest. If she is going for a PET, we can acquire the images needed on a CT PET scan.  4.  We will continue her current antibiotics for now, would follow her white count.  5.  If her blood cultures remain negative, would start removing antibiotics.   This is a moderately complex infectious disease case.   Thank you very much for involving me in this patient's care.   ____________________________ Rosalyn GessMichael E. Seville Downs, MD meb:jm D: 01/08/2012 16:42:09 ET T: 01/08/2012 21:27:38 ET JOB#: [id number]  cc: Rosalyn GessMichael E. Tyanna Hach, MD, <Dictator> Jonaven Hilgers E Dorathea Faerber MD ELECTRONICALLY SIGNED 01/11/2012 12:18

## 2014-05-08 NOTE — Consult Note (Signed)
Reason for Visit: This 64 year old Female patient presents to the clinic for initial evaluation of  Lung cancer .   Referred by Dr. Orlie DakinFinnegan.  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   Pathology Report Pathology report reviewed    Imaging Report PET/CT scan and CT scans reviewed    Referral Report Clinical notes reviewed    Planned Treatment Regimen Concurrent chemotherapy and radiation therapy    HPI   patient is a 64 year old female with obvious mental issues in quitting poor historian important indicated skills who suffered dramatic injury to her ribs approximately 2 months prior. Workup including CT scan showed a right lower lobe lung mass suspicious for malignancy. She went on to have aPET/CT scan again confirming avid uptake in a right lower lobe lesion as well as mediastinal nodes. She underwent endobronchial ultrasound with biopsy of mediastinal nodes positive for metastatic squamous cell carcinoma.she has been evaluated by medical oncology and is referred today to radiation oncology for consideration of concurrent treatment. She does have chronic cough according to the patient takes a bottle of over-the-counter cough syrup every night to sleep. She is no hemoptysis or no marked shortness of breath.she does have chronic nausea and is being treated with Compazine.  Past Hx:    Anxiety:    Hyperlipidemia:    Emphysema:    COPD:   Past, Family and Social History:   Past Medical History positive    Cardiovascular hyperlipidemia    Respiratory COPD; Emphysema    Neurological/Psychiatric anxiety; CVA; depression    Past Surgical History cholecystectomy    Family History positive    Social History positive    Social History Comments Greater than 40-pack-year smoking history, occasional EtOH his history    Additional Past Medical and Surgical History Accompanied  by her sister today   Allergies:   No Known Allergies:   Home Meds:  Home Medications: Medication Instructions Status  fentaNYL 50 mcg/hr transdermal film, extended release 1 PATCH transdermal every 72 hours Active  Levaquin 500 mg tablet 1 tab(s) orally every 24 hours x 7 days  Active  fentanyl 25 mcg/hr transdermal film, extended release 1  transdermal patch, change every 3 days at the same time of day, remove old patch and clean skin, rotate sites each application Active  Compazine tablet 10 mg 1 tab(s) orally every 6 hours x 30 days as needed   Active  acetaminophen-HYDROcodone 325 mg-5 mg oral tablet 1 tab(s) orally every 6 hours, As Needed Active  ondansetron 4 mg oral tablet 1 tab(s) orally every 6 hours, As Needed Active  IBU 600 mg tablet 1 tab(s) orally 3 times a day x 7 days Active  Lexapro 20 mg oral tablet 1 tab(s) orally once a day Active  trazodone 50 mg oral tablet tab(s) orally once a day (at bedtime) Active  Lovaza 1000 mg oral capsule 2 cap(s) orally 2 times a day Active  Xanax 1 mg oral tablet 1 tab(s) orally 3 times a day, As Needed- for Anxiety, Nervousness  Active  omeprazole 40 mg oral delayed release capsule 1 cap(s) orally once a day Active   Review of Systems:   Performance Status (ECOG) 1    Skin negative    Breast negative    Ophthalmologic negative    ENMT negative    Respiratory and Thorax see HPI    Cardiovascular negative    Gastrointestinal negative  Genitourinary negative    Musculoskeletal negative    Neurological see HPI    Psychiatric see HPI    Hematology/Lymphatics negative    Endocrine negative    Allergic/Immunologic negative   Physical Exam:  General/Skin/HEENT:   Skin normal    Eyes normal    ENMT normal    Head and Neck normal    Additional PE Well-developed wheelchair-bound female in NAD. Lungs are clear to A&P cardiac examination shows regular rate and rhythm. Abdomen is benign. No cervical or  supraclavicular adenopathy is identified.   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  Plan:   I discussed the case personally with Dr. Orlie Dakin. Would like to go ahead with concurrent chemotherapy and radiation therapy. Based on the patient's neurologic history as well as her chronic lung disease we will treat her initially to 4000 cGy over 4 weeks and evaluate for response after a two-week break. We used three-dimensional treatment planning to target her lung tumor. This will be given with concurrent chemotherapy. Risks and benefits of treatment including exacerbation of chronic lung symptoms including cough and shortness of breath were all discussed in detail with the patient. Other side effects including fatigue and alteration of blood counts were also discussed. Patient is encouraged to increase her nutritional intake and we are trying to arrange with dietary for Ensure samples for the patient. I have set her up for CT simulation next week. We will coordinate her chemotherapy with Dr. Orlie Dakin staff.  I would like to take this opportunity to thank you for allowing me to continue to participate in this patient's care.  CC Referral:   cc: Dr. Ellsworth Lennox   Electronic Signatures: Rushie Chestnut, Gordy Councilman (MD)  (Signed 17-Jul-13 12:45)  Authored: HPI, Diagnosis, Past Hx, PFSH, Allergies, Home Meds, ROS, Physical Exam, Encounter Assessment and Plan, CC Referring Physician   Last Updated: 17-Jul-13 12:45 by Rebeca Alert (MD)

## 2014-05-08 NOTE — Discharge Summary (Signed)
PATIENT NAME:  Stacey Lynch, [name] MR#:  [id number] DATE OF BIRTH:  1950/11/15  DATE OF ADMISSION:  10/07/2011 DATE OF DISCHARGE:  10/13/2011 0  DISCHARGE DIAGNOSES:  1. Deep vein thrombosis.  2. Urinary tract infection.  3. Recent pelvic fracture.  4. Lung cancer.  5. Recent stroke.   DISCHARGE MEDICATIONS:  1. Norco 5/325 one p.r.n. q.6.  2. Xanax 1 mg p.o. t.i.d.  3. Fentanyl patch 25 mcg apply q.72 hours.  4. Reglan 10 mg t.i.d.  5. Lansoprazole 30 mg daily.  6. Dexamethasone 4 mg b.i.d.  7. Metoprolol tartrate 25 mg b.i.d.  8. Lexapro 20 mg daily.  9. Promethazine 25 mg p.r.n. q.6.  10. Folic acid 1 mg daily.  11. Ascriptin 325 mg daily. 12. Cetirizine 5 mg once daily.  13. Advair Diskus 250/50 b.i.d.  14. Prochlorperazine 10 mg q.6 p.r.n.  15. Trazodone 50 mg at bedtime.  16. Sucralfate 1 gram 4 times a day.  17. Xanax 1 mg t.i.d.  18. Ondansetron 4 mg p.r.n. q.6.  19. Benadryl 25 mg p.r.n. daily.  20. Vitamin B12 1000 mcg daily.  21. Thiamine 100 mg daily.  22. Senna Plus 2 tabs at bedtime.  23. Ibuprofen as needed.  24. Loperamide 1 mg/7.5 mL p.r.n. for diarrhea.  25. P.r.n. Maalox. 26. Ciprofloxacin 500 mg twice a day x2 days.  27. Warfarin 7.5 mg daily.   DIET: Low sodium.   DIET CONSISTENCY: Regular.   ACTIVITY: As tolerated. Will need physical therapy.   FOLLOW-UP: Follow-up in 1 to 2 weeks after discharge from skilled nursing facility.   HOSPITAL COURSE: This lady was admitted through the Emergency Room complaining of pain and swelling in her left leg. She underwent duplex ultrasound of the leg which revealed DVT. Please refer to history and physical for details. The patient was started on low molecular weight heparin and Coumadin for full anticoagulation. Lovenox was discontinued after INR was 1.9. She stayed an additional day to achieve therapeutic INR of 2.0. The patient is unable to ambulate due to recent pelvic fracture. She is also being treated for lung  cancer and is scheduled to resume her chemotherapy on 10/16/2011. The patient'[name] hospital stay was uneventful. She was discharged to a skilled nursing facility in satisfactory condition.   DISPOSITION: Skilled nursing facility.   DISCHARGE INSTRUCTIONS: The patient is to have INR on 10/15/2011 at the nursing facility.   DISCHARGE PROCESS TIME SPENT: 35 minutes.   ____________________________ Silas FloodSheikh A. Ellsworth Lennoxejan-Sie, MD sat:drc D: 10/13/2011 13:26:46 ET T: 10/13/2011 14:56:19 ET JOB#: [id number]  cc: Sheikh A. Ellsworth Lennoxejan-Sie, MD, <Dictator> Charlesetta GaribaldiSHEIKH A TEJAN-SIE MD ELECTRONICALLY SIGNED 10/20/2011 13:29

## 2014-05-08 NOTE — H&P (Signed)
PATIENT NAME:  Stacey Lynch, Stacey Lynch MR#:  [id number] DATE OF BIRTH:  Aug 14, 1950  DATE OF ADMISSION:  01/07/2012  PRIMARY CARE PHYSICIAN: Dr. Ellsworth Lennox.   ONCOLOGIST: Dr. Orlie Dakin.    CHIEF COMPLAINT: Fatigue, cough, fever.   HISTORY OF PRESENTING ILLNESS: This is a 64 year old Caucasian female patient with history of right lung cancer, atrial fibrillation on Coumadin, who recently finished chemotherapy 2 weeks prior, presents to the Emergency Room complaining of extreme weakness, trouble breathing, nonproductive cough. The patient was found to be tachycardic in the 120s with white count elevated at 21,000, x-ray showing a left lower lobe infiltrate, and is being admitted to the hospitalist service for healthcare-acquired pneumonia with sepsis. The patient was recently treated in the hospital for a similar left lower lobe pneumonia and discharged on 12/24/2011. The patient was discharged home on Levaquin, which she finished. The patient saw Dr. Ellsworth Lennox 2 days prior. No change in medications.   The patient also has pain in her right ankle and left wrist, which are new. She also has chronic back pain. The patient is on narcotic medications at home.   PAST MEDICAL HISTORY:  1.  COPD. 2.  Squamous cell right lung cancer, status post chemo and radiation therapy.  3.  Hypertension.  4.  Chronic pain.  5.  GERD.  6.  Depression.  7.  DVT.  8.  Atrial fibrillation on Coumadin.  9.  Recent left lower lobe pneumonia.   ALLERGIES: AMBIEN, WHICH CAUSES OVERSEDATION.    SOCIAL HISTORY: The patient was a long-time smoker and abused alcohol, but quit since her diagnosis of lung cancer. No illicit drug use. Lives at home with her sons. Ambulates on her own or uses a cane at times. Is on 2 liters oxygen at all times at home.   FAMILY HISTORY: Father had colon cancer. Brother had kidney cancer.   HOME MEDICATIONS: Include: 1.  Aspirin 81 mg oral daily.  2.  Ativan 1 mg 3 times a day for anxiety.  3.   Cetirizine 5 mg daily.  4.  Baclofen 10 mg oral 3 times a day.  5.  Codeine/guaifenesin 5 mL oral every 6 hours.  6.  Lexapro 20 mg oral once a day.  7.  Lomotil 2.5/0.025 mg 2 tablets oral every 3 hours as needed for diarrhea.  8.  Lovaza 1000 mg 2 capsules oral 2 times a day.  9.  Omeprazole 40 mg oral daily.  10.  Percocet 5/325, 1 tablet orally every 6 hours.  11.  Promethazine 25 mg 1 tablet every 4 to 6 hours as needed for nausea, vomiting.  12.  Ultracet 37.5/325 mg 1 tablet oral every 6 hours.  13.  Xanax 1 mg oral 3 times a day.  14.  Aspirin 81 mg oral daily.  15.  Astelin 2 sprays nasally once a day.  16.  Augmentin 875, 1 tablet oral 2 times a day for 10 days. The patient finished her medication.   REVIEW OF SYSTEMS:  CONSTITUTIONAL: Complains of fatigue, weakness, fever at home. EYES: No blurred vision, pain, redness.  EARS, NOSE, THROAT: No tinnitus, ear pain. Has some hearing loss.  RESPIRATORY: Has chronic dry cough, occasional wheeze. No hemoptysis. Has shortness of breath. Wears 2 liters oxygen at home.  CARDIOVASCULAR: No chest pain, orthopnea, edema.  GASTROINTESTINAL: No nausea, vomiting, diarrhea, abdominal pain.  GENITOURINARY: No dysuria, hematuria. Has some incontinence.  ENDOCRINE: No polyuria, nocturia, thyroid problems.  HEMATOLOGIC, LYMPHATIC: Has chronic anemia, easy bruising.  INTEGUMENTARY: No acne, rash, lesions.  MUSCULOSKELETAL: Has pain in her left wrist and right ankle. No swelling, redness.  NEUROLOGIC: No numbness, weakness, dysarthria or seizures.  PSYCHIATRIC: Has depression and anxiety.   PHYSICAL EXAMINATION:  VITAL SIGNS: Temperature 98.1, pulse 122, respirations 18, blood pressure 113/61, saturating 92% on room air and 96% on 2 liters oxygen.  GENERAL: Frail Caucasian female patient who looks older than her stated age, lying in bed in mild respiratory distress, coughing.  PSYCHIATRIC: Alert, oriented x 3, anxious. Good judgment.  HEENT:  Atraumatic, normocephalic. Oral mucosa moist and pink. External ears and nose normal. Pallor positive. No icterus. Pupils bilaterally equal and reactive to light.  NECK: Supple. No thyromegaly. No palpable lymph nodes. Trachea midline. No carotid bruits or JVD.  CARDIOVASCULAR: S1, S2, tachycardic, irregular without any murmurs. Peripheral pulses 2+. No edema.  RESPIRATORY: Using accessory muscles. Bronchial breath sounds in the left lower lobe with decreased air entry on both sides. No wheezing. Occasional rhonchi.  GASTROINTESTINAL: Soft abdomen, nontender. Bowel sounds present. No hepatosplenomegaly palpable.  SKIN: Warm and dry. No petechiae, rash, ulcers. Has some bruising over the right calf area.  NEUROLOGICAL: Motor strength 5/5 in upper and lower extremities. Sensation to fine touch intact all over. Cranial nerves II through XII intact.  LYMPHATIC: No cervical lymphadenopathy.   LABORATORY STUDIES: Glucose 89, BUN 15, creatinine 0.61, sodium 137, potassium 3.2, chloride 101, calcium 8.1, albumin 2.6, alkaline phosphatase 259. Troponin less than 0.02. WBC 19.7, hemoglobin 9.5, platelets of 555 with 2% bands. INR of 1.8. WBC shows no bacteria.   EKG shows sinus tachycardia with no acute ST or T wave changes, but the patient's tele in the room shows atrial fibrillation at rate of 115 to 125.   Chest x-ray, personally reviewed, shows left lower lobe infiltrate similar to her prior x-ray. Also right lower lobe chronic changes. No clear mass identified. Chronic changes of COPD.   ASSESSMENT AND PLAN:  1.  Sepsis with left lower lobe pneumonia. The patient was treated for a similar pneumonia of the left lower lobe, but presently has elevated white count, cough and tachycardia with shortness of breath. Also had recent chemotherapy compromising her immune system. Will admit the patient for intravenous antibiotics for healthcare-acquired pneumonia with multiresistant organisms. Send for blood and  sputum cultures. Oxygen support. Nebulizers as needed. Will await cultures and antibiotics can be quickly tapered off if cultures are negative. Her chest x-ray finding could be just resolving pneumonia from the prior episode. Will repeat a chest x-ray in the morning, PA and lateral.  2.  Sepsis due to above. 3.  Chronic anemia secondary to anemia of chronic disease and chemotherapy, seems to be stable.  4.  Atrial fibrillation with rapid ventricular rate, on Coumadin. Continue the Coumadin. Continue rate control medications. Her heart rate is elevated secondary to the in acute infection, needs to be monitored. The patient will be on a tele floor.  5.  Right-sided squamous cell lung cancer. Consult Dr. Orlie DakinFinnegan of oncology who she sees. The patient is scheduled for a PET scan on Monday as outpatient. She will follow up with him as outpatient.  6.  Chronic pain syndrome. Continue medications.  7.  Deep vein thrombosis prophylaxis: The patient is on Coumadin.   CODE STATUS: Full code.   TIME SPENT: Time spent today on this case was 65 minutes.   ____________________________ Molinda BailiffSrikar R. Niva Murren, MD srs:jm D: 01/07/2012 16:37:53 ET T: 01/07/2012 17:51:25 ET JOB#: [id number]  cc:  Jshawn Hurta R. Lasasha Brophy, MD, <Dictator> Sheikh A. Ellsworth Lennox, MD Tollie Pizza. Orlie Dakin, MD Orie Fisherman MD ELECTRONICALLY SIGNED 01/09/2012 73:71

## 2014-05-08 NOTE — Consult Note (Signed)
Brief Consult Note: Diagnosis: STAGE 3 LUNG,   CANCER  DVT ,  COPD ,  POSSIBLE PNEUMONIA , POSSIBLE CHF.   Patient was seen by consultant.   Consult note dictated.   Comments: SEE DICTATED NOTE   IS CURRENTLY ON TX HOLD, FOR POOR PERFORMANCE STATUS, AND RETAGING PET PLANNED. HAS DVT, COUMADIN HAS BEEN SUBTHERAPEUTIC. EXAM COARSE RHONCHI AND FEW RALES LEFT LUNG. NEURO GROSSLY NON FOCAL, CONFUSED, LETHARGIC.Marland KitchenPLAN NOTHING ACUTELY FROM HEME, ONC. PET MONDAY IF IMPROVED, AND STABLE, AND ABLE TO COOPERATE WITH EXAM, OTHERWISE WOULD RESCHEDULE.  WILL ASK MEDICINE TO REVALUATE FOR ALTERED MENTAL STATUS. MET B , CA, 02 SAT OK. MAY BE MEDS. WOULD REDUCE DURAGESIC. INITIALLY NON CONTRAST HEAD CT, POSSIBLE MRI BRAIN IF DOES NOT IMPROVE.  CONSIDER ABG....NURSING HAS CONTACTED BROTHER, HE SAYS HER CURRENT MENTAL STATUS IS HER BASELINE..WOULD STILL REDUCE PAIN  MEDS AND GET NON CONTRAST CT HEAD.Marland KitchenALSO , WITH DVT, AND INR of 1.2, I WOULD START HEPARIN DRIP UNTIL COUMADIN THERAPEUTIC IF HEAD CT NEG..WILL DISCUSS WITH MEDICINE, ALSO RE POSSIBLE DOSE OF LASIX.  Electronic Signatures: Dallas Schimke (MD)  (Signed 20-Dec-13 18:17)  Authored: Brief Consult Note   Last Updated: 20-Dec-13 18:17 by Dallas Schimke (MD)

## 2014-05-08 NOTE — Consult Note (Signed)
PATIENT NAME:  Stacey Lynch, Stacey Lynch MR#:  [id number] DATE OF BIRTH:  [birth year]  DATE OF CONSULTATION:  12/20/2011  REFERRING PHYSICIAN:  Katharina Caper, MD CONSULTING PHYSICIAN:  Lamar Blinks, MD  REASON FOR CONSULTATION: Atrial fibrillation, hypotension, and tachycardia.  CHIEF COMPLAINT: "I got short of breath."   HISTORY OF PRESENT ILLNESS: This is a 64 year old female with known deep venous thrombosis, pulmonary embolism, and cancer who has had continued chemotherapy for lung cancer. The patient has been short of breath with some weakness and has had new onset of pneumonia causing significant shortness of breath and hypoxia. With this she has had an increase in troponin most consistent with demand ischemia and hypoxia rather than acute coronary syndrome. She also has had atrial fibrillation with rapid ventricular rate as well as other tachycardia consistent with her current illness. She is hypoxic at this time. The patient has had known diastolic dysfunction and congestive heart failure in the past, but no evidence of systolic failure or previous myocardial infarction. The patient currently is slightly improved with current medical regimen for heart rate control and spontaneously getting back into normal rhythm.   REVIEW OF SYSTEMS: The remainder review of systems is negative for vision change, ringing in the ears, hearing loss, cough, congestion, heartburn, nausea, vomiting, diarrhea, bloody stools, stomach pain, extremity pain, leg weakness, cramping of the buttocks, known blood clots, headaches, blackouts, dizzy spells, nosebleeds, congestion, trouble swallowing, frequent urination, urination at night, muscle weakness, numbness, anxiety, depression, skin lesions, or skin rashes.   PAST MEDICAL HISTORY:  1. Lung cancer.  2. Hypotension.  3. Diastolic dysfunction heart failure. 4. Pulmonary embolism and/or deep venous thrombosis. 5. Minimal valvular heart disease.   FAMILY HISTORY: No family  members with early onset of cardiovascular disease.   SOCIAL HISTORY: The patient has had significant tobacco use, denies alcohol use and/or tobacco use at this time.   PHYSICAL EXAMINATION:   VITAL SIGNS: Blood pressure is 90/50 bilaterally and heart rate is 100 upright and 110 reclining and irregular.   GENERAL: She is a well-appearing elderly female in no acute distress.   HEENT: No icterus, thyromegaly, ulcers, hemorrhage, or xanthelasma.   CARDIOVASCULAR: Regular rate and rhythm. Normal S1 and S2 without murmur, gallop, or rub. Point of maximal impulse is diffuse. Carotid upstroke is normal without bruit. Jugular venous pressure is normal.   PULMONARY: Lungs have decreased breath sounds as well as some expiratory wheezes and rhonchi.   ABDOMEN: Soft and nontender without hepatosplenomegaly or masses. Abdominal aorta is normal size without bruit.   EXTREMITIES: 2+ radial, femoral, dorsal, and pedal pulses with no lower extremity edema, cyanosis, clubbing, or ulcers.   NEUROLOGIC: She is oriented to time, place, and person with normal mood and affect.   ASSESSMENT: This is a 64 year old female with lung cancer with acute pneumonia, hypoxia, demand ischemia, elevation of troponin, hypotension, atrial fibrillation with rapid ventricular rate, and deep venous thrombosis needing further treatment options.   RECOMMENDATIONS:  1. Continue aggressive treatment of pneumonia and/or infection at this time with hypoxia.  2. Follow for recurrent episodes of atrial fibrillation, but would not use any medications at this time due to maintenance of normal sinus rhythm today and concerns of hypotension with additional medications. 3. Would recommend reintroduction of anticoagulation for risk reduction in stroke with atrial fibrillation as well as deep venous thrombosis and/or pulmonary embolism if the patient does not have any worsening anemia.  4. Echocardiogram for LV systolic dysfunction, right  ventricular strain,  and pulmonary hypertension needing adjustments of medications.  5. No additional treatment of minimal elevation of troponin most consistent with demand ischemia rather than acute myocardial infarction. 6. Further diagnostic testing and/or treatment options after above.  ____________________________ Lamar BlinksBruce J. Kowalski, MD bjk:slb D: 12/20/2011 17:17:14 ET T: 12/21/2011 09:36:47 ET JOB#: [id number]  cc: Lamar BlinksBruce J. Kowalski, MD, <Dictator> Lamar BlinksBRUCE J KOWALSKI MD ELECTRONICALLY SIGNED 12/24/2011 13:51

## 2014-05-08 NOTE — Discharge Summary (Signed)
PATIENT NAME:  Stacey Lynch, Stacey Lynch MR#:  [id number] DATE OF BIRTH:  1950/01/30  DATE OF ADMISSION:  01/07/2012 DATE OF DISCHARGE:  01/12/2012  DISCHARGE DIAGNOSES:  Pneumonia, sepsis, osteomyelitis, lung cancer, history of deep vein thrombosis.   PROCEDURES PERFORMED:  1.  PET scan.  2.  Brain CT scan.   DISCHARGE MEDICATIONS:  1.  Coumadin 12.5 mg daily.  2.  Levaquin 500 mg daily x 11 days.  3.  Flagyl 500 mg 3 times a day x 11 days.  4.  Aspirin 81 mg daily.  5.  Omeprazole 40 mg once a day.  6.  Percocet 5/325, 1 tablet q.6 hours.   7.  Promethazine 25 mg every 4 to 6 hours.  8.  Ultracet 37.5/325, 1 tablet every 1 hours.  9.  Xanax 1 mg t.i.d.  10.  Astelin 137 mcg 2 sprays once a day.  11.  Baclofen 10 mg t.i.d.  12.  (Dictation Anomaly)  5 mL once a day.  13.  Flonase 50 mcg daily.  14.  Codeine/perphenazine 10/105 every 6 hours.  15.  Lexapro 20 mg daily.  16.  Lomotil 2.5/0.25, 2 tabs every 3 hours. 17.  Lovaza 1000 mg 2 capsules b.i.d.   DIET: Low sodium.   ACTIVITY: As tolerated.   FOLLOWUP:   1.  Dr. Ferrel LoganJantz in 1 to 2 weeks and with Dr. Orlie DakinFinnegan as scheduled.  2.  Dr. Elenore RotaJuengel, ENT, in 1 to 2 weeks.   CONSULTATION:  1.  ENT, Dr. Elenore RotaJuengel.  2.  Oncology, Dr. Orlie DakinFinnegan.  3.  Infectious disease, Dr. Casimiro NeedleMichael Blocker.   HOSPITAL COURSE: This lady was admitted on 12/19 with complaints of fatigue, cough, fever and confusion. At presentation, she was tachycardic with marked elevation in her white count at 21,000 and initial x-ray revealing a left lower lobe infiltrate. Please refer to the history and physical for full details. She was admitted with pneumonia, presumed sepsis and was placed on triple antibiotic therapy for presumed healthcare-associated pneumonia given her recent hospitalization a few weeks earlier. She also received high-rate intravenous fluids to address her sepsis syndrome. The patient responded quite well to this therapy with prompt reduction in her white cell  count to 12,000. I consulted infectious disease, Dr. Leavy CellaBlocker for antibiotic management. His clinical impression was that she most likely had a healthcare-associated pneumonia and he  reduced antibiotics accordingly. During her stay, the patient exhibited worsening mental status changes.  I ordered a CT scan which was negative for acute intracranial abnormalities, however, it did reveal a right mastoiditis. Ear, nose and throat consult was placed with Dr. Elenore RotaJuengel, who said that further evaluation could be performed as an outpatient, did not think that it was contributing to her acute illness. Dr. Leavy CellaBlocker changed antibiotics to oral Levaquin and Flagyl which she was discharged on. The patient'Stacey Lynch mental status gradually improved. She was alert and oriented x 2 which is her baseline pretty much. She underwent a previously-scheduled PET scan which did not reveal any bony metastases. The patient is being discharged to home with home health. Neighbor assists with medical care also.   DISCHARGE PROCESS: Time spent 35 minutes.      ____________________________ Silas FloodSheikh A. Ellsworth Lennoxejan-Sie, MD sat:cs D: 01/13/2012 12:34:31 ET T: 01/13/2012 19:49:38 ET JOB#: [id number]  cc: Sheikh A. Ellsworth Lennoxejan-Sie, MD, <Dictator> Charlesetta GaribaldiSHEIKH A TEJAN-SIE MD ELECTRONICALLY SIGNED 01/15/2012 13:28

## 2014-05-08 NOTE — Op Note (Signed)
PATIENT NAME:  Stacey Lynch, Stacey Lynch MR#:  [id number] DATE OF BIRTH:  Sep 28, 1950  DATE OF PROCEDURE:  10/22/2011  PREOPERATIVE DIAGNOSES:  1. Lung cancer.  2. Poor venous access.  3. Deep venous thrombosis.   POSTOPERATIVE DIAGNOSES:  1. Lung cancer.  2. Poor venous access.  3. Deep venous thrombosis.   PROCEDURES PERFORMED: 1. Ultrasound guidance for vascular access, left jugular vein.  2. Fluoroscopic guidance for placement of catheter.  3. Placement of CT compatible Infuse-a-Port, left jugular vein.   SURGEON: Annice NeedyJason S. Truth Wolaver, M.D.   ANESTHESIA: Local with moderate conscious sedation.   ESTIMATED BLOOD LOSS: Minimal.   FLUOROSCOPY TIME: Less than one minute.   CONTRAST USED: None.   INDICATION FOR PROCEDURE: This is a female who has lung cancer. She is scheduled to initiate chemotherapy within the week and Port-A-Cath is necessary for her chemotherapy access. Risks and benefits were discussed and informed consent was obtained.   DESCRIPTION OF PROCEDURE: The patient was brought to the vascular interventional radiology suite. The left neck was sterilely prepped and draped and a sterile surgical field was created. The left jugular vein was visualized with ultrasound and found to widely patent. It was assessed under direct ultrasound guidance without difficulty with a Seldinger needle. J-wire was placed. After skin nick and dilatation, the peel-away sheath was placed over the wire. The anterior pocket was then created in a transverse fashion and inferiorly. The port was secured with two Prolene sutures connected to the catheter. The catheter was tunneled from the subclavicular incision to the access site. Fluoroscopic guidance was used to cut the catheter to an appropriate length. It was placed through the peel-away sheath with the catheter tip located in the superior vena cava. We irrigated the wound. It was closed with 3-0 Vicryl and 4-0 Monocryl. A Huber needle was used to aspirate blood and  it flushed easily with heparinized saline. A sterile dressing was placed. The patient tolerated the procedure well.  ____________________________ Annice NeedyJason S. Corena Tilson, MD jsd:slb D: 10/22/2011 13:11:31 ET T: 10/22/2011 13:29:58 ET JOB#: [id number]  cc: Annice NeedyJason S. Catelin Manthe, MD, <Dictator> Annice NeedyJASON S Dilyn Smiles MD ELECTRONICALLY SIGNED 10/26/2011 11:18

## 2014-05-08 NOTE — Consult Note (Signed)
Brief Consult Note: Diagnosis: Lung cancer, admitted for porta- catheter insertion.   Patient was seen by consultant.   Consult note dictated.   Recommend to proceed with surgery or procedure.   Comments: Pt seen adn examined this am, stable condition CODE STATUS, FULL, surrogate decision maker is sister, Silverio DecampDeanna Beaver.  Electronic Signatures: Aurther Loftkafor, Danaye Sobh E (MD)  (Signed 03-Oct-13 09:27)  Authored: Brief Consult Note   Last Updated: 03-Oct-13 09:27 by Aurther Loftkafor, Lois Ostrom E (MD)
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
## 2014-05-08 NOTE — Consult Note (Signed)
Impression: 64yo female w/ h/o lung CA admitted with cough and leukocytosis.  She is confused and unable to provide any history.  She has leukocytosis and cough, but her CXR does not show a definitive infiltrate.  She has a nonocclusive DVT which may not be playing a role in her current situation.   I do not think that she has hospital acquired pneumonia given her CXR findings, but I do not have another explanation for her leukocytosis.  She does not seem to be able to produce a sputum sample. Would consider a CT scan of the chest.  If she will be going for a PET, we can acquire the images needed on a CT/PET scan. Continue current antibiotics for now.  Follow CBC.  If her BCx remain negative, would start removing antibiotics.  Electronic Signatures: Kemoni Ortega, Rosalyn GessMichael E (MD)  (Signed on 20-Dec-13 16:34)  Authored  Last Updated: 20-Dec-13 16:34 by Mccauley Diehl, Rosalyn GessMichael E (MD)

## 2014-05-08 NOTE — Consult Note (Signed)
Brief Consult Note: Diagnosis: afib with rapid rate and mild distoic chf with mininmal elevation of tropoinin due to demand ishcemia now in nsr and recovering form pneuminia.   Patient was seen by consultant.   Consult note dictated.   Comments: conitnnue tx of infection restart xarelto tomorrow if no concerns of anemia for dvt and pe and afib echo for lv dysfunciton no additonal tx of elevation of tropinin further cardiac diagnositcs as pt recovers from above.  Electronic Signatures: Lamar BlinksKowalski, Paisely Brick J (MD)  (Signed 01-Dec-13 16:46)  Authored: Brief Consult Note   Last Updated: 01-Dec-13 16:46 by Lamar BlinksKowalski, Vearl Allbaugh J (MD)

## 2014-05-08 NOTE — Discharge Summary (Signed)
PATIENT NAME:  Stacey Lynch, Stacey Lynch MR#:  [id number] DATE OF BIRTH:  Feb 06, 1950  DATE OF ADMISSION:  12/19/2011 DATE OF DISCHARGE:  12/24/2011   DISCHARGE DIAGNOSES:  1. Septic shock.  2. Pneumonia.  3. Deep vein thrombosis, remote history.   DISCHARGE MEDICATIONS:  1. Levaquin 500 mg daily x5 days.  2. Warfarin 5 mg daily.  3. Lovenox 70 mg b.i.d.   HOME MEDICATIONS: Unchanged except discontinuation of Xarelto.   PROCEDURE: Chest x-ray.   HOSPITAL COURSE: The patient was admitted through the Emergency Room where she presented with clinical features of septic shock. Please refer to the history and physical for full details. Shock was attributed to pneumonia. She was admitted to the Intensive Care Unit, initially placed on inotropic therapy and high rate intravenous fluids due to severe hypotension. She responded well to this therapy with normalization of her blood pressure and improvement of creatinine and was transferred to a monitored floor. The patient was treated with antibiotics to cover for possible hospital-acquired pneumonia as patient was hospitalized about a month ago. It was noted that the patient is a [employer] and conservative treatment for her DVT cannot be easily reversed except with human tissue based agents. Given that fact, we decided to change anticoagulation to Coumadin with Lovenox bridge. She will discharged on that regimen. The patient'[name] chronic pain was controlled with fentanyl patch and p.r.n. morphine. She did drop her blood pressure once this regimen and receive fluid bolus without any adverse events.   CONDITION ON DISCHARGE: Discharged home in satisfactory condition.   DIET: Low sodium.   ACTIVITY: As tolerated.   FOLLOW-UP: Follow-up with Dr. Ellsworth Lennoxejan-Sie in 1 to 2 weeks, however, to have PT/INR drawn on Monday, 12/28/2011, [facility].   TIME SPENT ON DISCHARGE: 36 minutes.   ____________________________ Silas FloodSheikh A. Ellsworth Lennoxejan-Sie,  MD sat:drc D: 12/24/2011 13:36:59 ET T: 12/24/2011 14:01:48 ET JOB#: [id number]  cc: Sheikh A. Ellsworth Lennoxejan-Sie, MD, <Dictator> Charlesetta GaribaldiSHEIKH A TEJAN-SIE MD ELECTRONICALLY SIGNED 12/26/2011 8:58

## 2014-05-08 NOTE — H&P (Signed)
PATIENT NAME:  Stacey Lynch, Stacey E MR#:  [id number] DATE OF BIRTH:  1951-01-12  DATE OF ADMISSION:  09/03/2011  PRIMARY CARE PHYSICIAN: Dr. 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 Patient is not able to provide much history because of some underlying dementia, however, she admitted that when she was laying in bed and screaming for help she felt her heart racing. She also was having some shortness of breath as well as chest pains. She felt somewhat dizzy, however, denied any presyncopal or syncopal episodes. She admits of feeling lightheaded. She admits of cough, however, no phlegm production. Admits of fever and chills earlier today. Because of tachycardia and midsternal chest pain she was brought here to Emergency Room for further evaluation. In the Emergency Room she was noted to have SVT with heart rate 160s to 170s according to Emergency Room physician, Dr. Clemens Catholicagsdale. She was given 5 mg of Lopressor  (dictation cut off here)   ____________________________ Katharina Caperima Nesreen Albano, MD rv:cms D: 09/03/2011 19:17:32 ET T: 09/04/2011 06:25:25 ET JOB#: [id number]  cc: Katharina Caperima Kadeisha Betsch, MD, <Dictator> Tollie Pizzaimothy J. Orlie DakinFinnegan, MD Katharina CaperIMA Cariann Kinnamon MD ELECTRONICALLY SIGNED 09/19/2011 15:21

## 2014-05-08 NOTE — H&P (Signed)
PATIENT NAME:  Stacey Lynch, Stacey Lynch DATE OF BIRTH:  1950-02-05  DATE OF ADMISSION:  08/07/2011  REFERRING PHYSICIAN: Dr. Carollee MassedKaminski.   PRIMARY CARE PHYSICIAN: Sheikh A. Ellsworth Lennoxejan-Sie, MD. Family requested patient to be followed by [facility] during this admission.   CHIEF COMPLAINT: Left arm weakness and altered mental status.   HISTORY OF PRESENT ILLNESS: This is a 64 year old female with significant past medical history of cerebrovascular accident, chronic obstructive pulmonary disease, hypertension, tobacco abuse, GERD, anxiety with recent diagnosis of lung cancer, being planned for radiation and chemotherapy next week, presents with complaints of altered mental status and left arm weakness. Upon presentation patient was confused and history was initially obtained from the family. Upon my examination patient was awake, alert x3. The patient reports she has been feeling weak with unsteady gait. As well she reports that she noticed some weakness in her left upper extremity started this afternoon. She lives with her brother who reports he noticed her starting using her right arm instead of her left arm. Upon presentation to the ED, the patient's CT did not show any acute abnormalities, did show old infarct in the right cerebral hemisphere which is stable. Patient denies any loss of consciousness, and urinary incontinence or stool incontinence, cough, shortness of breath, palpitation, leg swelling. Family at bedside, brother and sister, and they report patient is drinking excessively, at least 6 to 8 drinks every night and the patient's sister describes her as self medicating as well she is on p.r.n. Xanax for anxiety. The patient denies any use of Xanax today.   PAST MEDICAL HISTORY:  1. History of right hip fracture June 2011.  2. History of cerebrovascular accident.  3. Chronic obstructive pulmonary disease.  4. Hypertension.  5. Tobacco abuse.  6. Gastroesophageal reflux disease.  7. Anxiety  and depression.  8. History of positive hepatitis A.   REVIEW OF SYSTEMS: Denies any fever, fatigue, initially had altered mental status. EYES: Denies blurry vision, double vision, or pain. ENT: Denies tinnitus, ear pain, hearing loss. RESPIRATORY: Denies cough, hemoptysis, dyspnea. CARDIOVASCULAR: Denies chest pain, edema, arrhythmia, palpitation. Had recent fall in March 2013 with rib fractures, tender. GI: Denies nausea, vomiting, diarrhea, abdominal pain, constipation. GU: Denies dysuria, hematuria, renal colic. ENDOCRINE: Denies polyuria, polydipsia, heat or cold intolerance.  HEMATOLOGY: Denies anemia, easy bruising, bleeding diathesis. INTEGUMENT: Denies any acne or rash. MUSCULOSKELETAL: Denies any neck pain, shoulder pain, arthritis, swelling. NEUROLOGIC: Has left arm weakness and history of cerebrovascular accident.  PSYCHIATRIC: History of anxiety and depression.   SOCIAL HISTORY: Patient smokes 1 pack per day and drinks alcohol 6 to 8 drinks per day. She reports as 1 pack a day. No history of illicit drug use. Lives with her brother.   FAMILY HISTORY: Significant for hypertension.   ALLERGIES: No known drug allergies.   HOME MEDICATIONS:  1. Xanax 1 mg oral 3 times a day as needed.  2. Trazodone 50 mg at bedtime.  3. Ondansetron  4 mg every 6 hours as needed.  4. Omeprazole 40 mg daily.  5. Lovaza 1000 mg 2 capsules 2 times a day.  6. Lexapro 1 tablet 20 mg daily.  7. Levaquin 500 mg oral daily.  9. Fentanyl patch 75 mcg every 72 hours.  10. Compazine 10 mg every 6 hours as needed.   PHYSICAL EXAMINATION:  VITAL SIGNS: Temperature 97.8, pulse 104, respiratory rate 18, blood pressure 100/77, saturating 96% on room air.   GENERAL: Frail, elderly female who is comfortable  and in no apparent distress.   HEENT: Head atraumatic, normocephalic. Pupils equal, reactive to light. Pink conjunctivae. Anicteric sclerae. Moist oral mucosa.   NECK: Supple. No thyromegaly. No JVD.    CHEST: Good air entry bilaterally. No wheezing, rales, or rhonchi. Has mild tenderness to palpation on the right chest area from her rib fractures.   CARDIOVASCULAR: S1, S2 heard. No rubs, murmur, or gallops.   ABDOMEN: Soft, nontender, nondistended. Bowel sounds present.   EXTREMITIES: No edema. No clubbing, no cyanosis.   PSYCHIATRIC: Cognition is impaired even though she is awake, alert x3. Appears to be mildly confused.     NEUROLOGIC: Cranial nerves grossly intact. Has left upper extremity motor weakness 2 to 3 out of 5, rest of her extremities 5 out of 5. Sensation symmetrical.   SKIN: No rash.   PERTINENT LABORATORIES: Glucose 110, BUN 2 creatinine 0.53, sodium 133, potassium 3.5, chloride 95, CO2 of 30. White blood cells 14.7, hemoglobin 13.3, hematocrit 43.4, platelets 269,000. ABG showing pH of 7.44, pCO2 of 40, pO2 of 56, oxygen saturation 93%.   ASSESSMENT AND PLAN: This is a 64 year old female with recent diagnosis of lung cancer, being planned to have chemotherapy and radiation therapy next week, presents with left upper extremity weakness and altered mental status. CT brain did not show any acute findings.  1. Cerebrovascular accident. The patient was started on aspirin 81 mg daily, already given 325 aspirin in ED. Will be admitted to telemetry floor. We will check echocardiogram, and we will check MRI of the brain with and without contrast; and we will check bilateral carotid Doppler's.  The patient had recent PET scan without any evidence of brain metastasis so this is unlikely brain mets. 2. Lung cancer.  Patient is planned next week for chemotherapy and radiation therapy.  3. Tobacco abuse. The patient was counseled. Will be started on nicotine patch.  4. Alcohol abuse. The patient will be started on CIWA protocol. 5. Gastroesophageal reflux disease. The patient will be continued on Protonix.  6. History of chronic obstructive pulmonary disease.  The patient does not  appear to be in chronic obstructive pulmonary disease exacerbation. We will have her on p.r.n. albuterol.  7. Gastrointestinal prophylaxis, Protonix.  Deep vein thrombosis prophylaxis, subcutaneous heparin.   CODE STATUS: FULL CODE.   TOTAL TIME SPENT ON ADMISSION AND PATIENT CARE: 60 minutes.    ____________________________ Starleen Arms, MD dse:vtd D: 08/07/2011 21:33:41 ET T: 08/08/2011 07:54:38 ET JOB#: [id number]  cc: Starleen Arms, MD, <Dictator> Silas Flood. Ellsworth Lennox, MD DAWOOD Teena Irani MD ELECTRONICALLY SIGNED 08/08/2011 22:15

## 2014-05-08 NOTE — Discharge Summary (Signed)
PATIENT NAME:  Stacey LappingROXLER, Ketrina E MR#:  [id number] DATE OF BIRTH:  01-23-1950  DATE OF ADMISSION:  08/07/2011 DATE OF DISCHARGE:  08/19/2011   ADDENDUM:  The patient since 08/14/2011 has been doing well, was tapered off on her IV steroids. Presently her lung examination does not show any wheezing. There is no change in her mental status. The patient's metoprolol has been discontinued. The patient's diet, activity, and follow-up remain the same.   DISCHARGE MEDICATIONS:  1. Atorvastatin 10 mg a day.  2. Advair Diskus 250/50 twice a day inhaled. 3. Nicotine patch 21 mg transdermal once a day.  4. Albuterol ipratropium 3 mL nebulizer every six hours as needed for shortness of breath or wheezing.  5. Aspirin 325 mg once a day.  6. Prednisone taper 20 mg a day for five days and then 10 mg a day for five days and stop.  7. Omeprazole 40 mg oral daily.  8. Xanax 1 mg oral 3 times a day.  9. Cetirizine 1 mg/mL 10 mL daily.  10. Fluticasone 50 mcg inhalation daily.  11. Promethazine 25 mg q.6 hours as needed for nausea.  12. Trazodone 50 mg oral at bedtime.  13. Lexapro 20 mg oral daily.   Time spent on discharge activity and coordinating care on the day of discharge: 34 minutes.  ____________________________ Molinda BailiffSrikar R. Kaleab Frasier, MD srs:drc D: 08/19/2011 15:39:38 ET T: 08/19/2011 15:50:49 ET JOB#: [id number]  cc: Wardell HeathSrikar R. Elleigh Cassetta, MD, <Dictator> Orie FishermanSRIKAR R Marzella Miracle MD ELECTRONICALLY SIGNED 09/01/2011 0:16

## 2014-05-08 NOTE — Consult Note (Signed)
Patient admitted with DVT.  Had hip fracture from recent fall and is not ambulatory.  Has history of malignancy.  Asked about benefits of IVC filter.  At this time, would recommend anticoagulation alone as she is bedbound and not a fall risk, and has not been on anti-coagulation.  If she has bleeding or progression of clot on anticoagulation, would the place IVC filter.    Electronic Signatures: Annice Needyew, Jason S (MD)  (Signed on 19-Sep-13 14:47)  Authored  Last Updated: 19-Sep-13 14:47 by Annice Needyew, Jason S (MD)

## 2014-05-08 NOTE — H&P (Signed)
PATIENT NAME:  Stacey Lynch, Stacey Lynch MR#:  [id number] DATE OF BIRTH:  1950/05/04  DATE OF ADMISSION:  12/19/2011  PRIMARY CARE PHYSICIAN: Bluford Main, MD   ONCOLOGIST: Gerarda Fraction, MD    CHIEF COMPLAINT: Pleuritic chest pain and shortness of breath.   HISTORY OF PRESENT ILLNESS: This is a 64 year old female who presents from home due to some pleuritic chest pain and shortness of breath. The patient has a history of lung cancer and is followed by Dr. Orlie Dakin. She received chemotherapy about 10 to 12 days ago, was seen at his office and noted to have a urinary tract infection, put on some Levaquin, although she was not feeling any better, felt more lethargic, more weak, was having a lot of chest pains which were pleuritic in nature with a nonproductive cough; and, therefore, she was brought to the ER for further evaluation. In the Emergency Room, the patient was noted to be tachycardic, hypotensive. Also noted to have a low-grade fever. She was noted to have a leukocytosis of 23,000 and also noted to be in supraventricular tachycardia. The patient's chest x-ray showed a left lower lobe pneumonia with a pleural effusion. She was noted to be in septic shock likely secondary from pneumonia. Hospitalist Services were then contacted for further treatment and evaluation.   REVIEW OF SYSTEMS: CONSTITUTIONAL: Positive documented fever of 100.1. Positive weakness. No weight gain, no weight loss. EYES: No blurred or double vision. ENT: No tinnitus. No postnasal drip, no redness of the oropharynx. RESPIRATORY: Positive cough, no wheeze, no hemoptysis. Positive dyspnea. CARDIOVASCULAR: Positive pleuritic chest pain. No orthopnea, no palpitations, no syncope. GI: Positive nausea. No vomiting, no diarrhea, no abdominal pain, no melena, no hematochezia. GU: No dysuria, no hematuria. ENDOCRINE: No polyuria or nocturia. No heat or cold intolerance. HEMATOLOGIC: No anemia. No bruising. No bleeding. INTEGUMENTARY: No  rashes. No lesions. MUSCULOSKELETAL: No arthritis, no swelling, and no gout. NEUROLOGIC: No numbness, no tingling, no ataxia, no seizure-type activity. PSYCHIATRIC: No anxiety, no insomnia, no ADD.   PAST MEDICAL HISTORY:  1. Squamous cell lung cancer, status post chemoradiation.  2. Hypertension.  3. 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  4. Gastroesophageal reflux disease. 5. Depression.  6. History of deep vein thrombosis.   ALLERGIES: Ambien, which causes oversedation.   SOCIAL HISTORY: She used to be a long-time smoker, quit when she was diagnosed with the lung cancer about six months ago. She does have a 30 to 40 pack-year smoking history. No alcohol abuse. No illicit drug abuse. She lives at home with one of her sons.   FAMILY HISTORY: The patient's father had colon cancer. Brother had kidney cancer.   CURRENT MEDICATIONS:  1. Aspirin 81 mg daily.  2. Ativan 1 mg t.i.d. as needed for anxiety.  3. Cetirizine 5 mg daily.  4. Dexamethasone 4 mg b.i.d.  5. Diphenhydramine 25 mg as needed. 6. Duragesic 50 mcg every 72 hours.  7. Lexapro 20 mg daily.  8. Folic acid 1 mg daily.  9. Lasix 20 mg daily.  10. Ibuprofen 400 mg every 4 hours as needed.  11. Levaquin 500 mg daily.  12. Metoprolol tartrate 25 mg b.i.d.  13. Norco 5/325 mg, 1 tab every six hours as needed.  14. Zofran 4 mg every six hours as needed.  15. Prochlorperazine 10 mg every six hours as needed.  16. Promethazine 25 mg every six hours as needed.  17. Senokot two tabs at bedtime.  18. Sucralfate 1 gram q.i.d. before meals.  19. Thiamine 100 mg daily.  20. Trazodone 50 mg at bedtime.  21. Tussin DM 1 to 2  tsp every  four to six hours  as needed.  22. Vitamin B12 1000 mcg daily.  23. Xarelto 20 mg daily.   PHYSICAL EXAMINATION: On admission, temperature is 100.1, pulse 101, respirations 20, blood pressure 78/44, saturations 95% on 2 liters nasal cannula.   GENERAL: She is a pleasant-appearing female,  lethargic, but in no apparent distress.   HEENT: Head is atraumatic, normocephalic. Extraocular muscles are intact. Pupils are equal and reactive to light. Sclerae are anicteric. No conjunctival injection. No pharyngeal erythema. Oropharynx is dry.   NECK: Supple. No jugular venous distention, no bruits, no lymphadenopathy or thyromegaly.   HEART: Regular rate and rhythm, tachycardic. No murmurs, no rubs, and no clicks.   LUNGS: Clear to auscultation anteriorly. No rales, no rhonchi, no wheezes. Negative use of accessory muscles.   ABDOMEN: Soft, flat, nontender, nondistended, has good bowel sounds. No hepatosplenomegaly appreciated.   EXTREMITIES: No evidence of any cyanosis, clubbing or peripheral edema. She has +2 pedal and radial pulses bilaterally.   NEUROLOGICAL: The patient is alert, awake, and oriented x2, moves all extremities spontaneously. No other focal motor or sensory deficits appreciated.   SKIN: Moist and warm with no rashes.   LYMPHATIC: There is no cervical or axillary lymphadenopathy.   LABORATORY, DIAGNOSTIC AND RADIOLOGICAL DATA: Serum glucose 122, BUN 15, creatinine 0.6, sodium 131, potassium 2.5, chloride 94, bicarbonate 31. LFTs showed alkaline phosphatase 280, AST 44, ALT 120, albumin 2.2. Troponin is less than 0.02. White cell count is 23.5, hemoglobin 9.1, hematocrit 28.0, platelet count 190. Urinalysis was within normal limits. The patient did have a chest x-ray done which showed findings worrisome of left lower lobe atelectasis versus pneumonia.   ASSESSMENT AND PLAN: This is a 64 year old female with a history of squamous cell lung cancer, status post chemoradiation, history of deep vein thrombosis, hypertension, previous transient ischemic attacks, gastroesophageal reflux disease, chronic pain due to malignancy, presents to the hospital due to shortness of breath, cough and chest pain, noted to be severely hypotensive and in septic shock secondary to pneumonia.    1. Septic shock:  This is likely secondary to pneumonia. The patient's chest x-ray is suggestive of a left lower lip lobe infiltrate with effusion. The patient had a recent urinary tract infection, but a urinalysis here is normal, therefore, it has probably been treated. I will start her empirically on IV vancomycin and Zosyn for nosocomial pneumonia, give her aggressive IV fluids, follow hemodynamics, used Levophed if needed if she remains hypotensive. Follow her blood cultures.  2. Pneumonia: Again, this is likely nosocomial pneumonia. She was recently hospitalized about a month or two ago. I will treat her with IV vancomycin and Zosyn and follow blood and sputum cultures.  3. Leukocytosis: This is likely secondary to the pneumonia. I will watch her white cell count with treatment of IV antibiotics.  4. Hypokalemia: This is likely secondary to poor p.o. intake. I will go ahead and replace her potassium accordingly and repeat it in the morning, check her magnesium level and replace that if needed.  5. Supraventricular tachycardia: The patient developed supraventricular tachycardia while here in the Emergency Room and was given adenosine and noted to be in atrial fibrillation/flutter. This is likely supraventricular tachycardia to her sepsis and pneumonia. I will give her aggressive IV fluids to see if this slows her heart rate down, also replace her potassium accordingly.  Follow serial cardiac markers. Her first set of troponins is negative. If needed, we will give her pulse doses of  IV Cardizem if her hypotension improves.  6. Gastroesophageal reflux disease: Continue with IV Protonix and Carafate.  7. Hypertension: She is in septic shock and hypotensive; therefore, I will hold all her antihypertensives for now.  8. Chronic pain: This is likely secondary to a malignancy. We will hold a Duragesic patch and Norco for now given the fact that she is severely hypotensive.  9. History of deep vein  thrombosis: We will continue her Xarelto. The patient is currently not anemic although it is important to note that she is a Jehov[employer]'s Witness and likely cannot be transfused if needed.   CODE STATUS:  The patient is a FULL CODE.          CRITICAL CARE TIME SPENT: 50 minutes.   ____________________________ Rolly PancakeVivek J. Cherlynn KaiserSainani, MD vjs:cbb D: 12/19/2011 19:38:44 ET T: 12/20/2011 08:45:19 ET JOB#: [id number]  cc: Rolly PancakeVivek J. Cherlynn KaiserSainani, MD, <Dictator> Sheikh A. Ellsworth Lennoxejan-Sie, MD [place] SirenVIVEK J Javarious Elsayed MD ELECTRONICALLY SIGNED 12/24/2011 20:34

## 2014-05-08 NOTE — Discharge Summary (Signed)
PATIENT NAME:  Stacey Lynch, Stacey Lynch MR#:  [id number] DATE OF BIRTH:  [birth year]  DATE OF ADMISSION:  08/07/2011 DATE OF DISCHARGE:  08/14/2011   ADMISSION DIAGNOSIS: Confusion.   DISCHARGE DIAGNOSES:  1. Right frontal parietal cerebrovascular accident.  2. Acute respiratory failure due to right lower lobe pneumonia.  3. Tobacco abuse.  4. Alcohol abuse.  5. Anxiety/depression.  6. Hypokalemia.  7. Right lower lobe pneumonia.  8. Hypertension.  9. Known history of lung cancer, followed by Dr. Rushie Chestnut.   CONSULTS: None.   PERTINENT LABORATORY AT DISCHARGE: White blood cells 13, hemoglobin 13.5, hematocrit 43.5, platelets 416, sodium 129, potassium 4.2, chloride 92, bicarb 27, BUN 9, creatinine 0.46.   Chest x-ray is consistent with a right lower lobe pneumonia.   CT of the head showed no acute intracranial hemorrhage or CVA.   MRI of the brain shows acute nonhemorrhagic right frontal and parietal lobe with a watershed distribution.   MRA shows no significant intracranial aneurysm or stenosis.   Carotid Doppler showed no significant stenosis.   2-D echocardiogram showed EF of 55%, mild MR. No other valvular abnormalities.  Cholesterol 112, LDL 58.   HOSPITAL COURSE: The patient is a 64 year old female who presented with confusion, found to have a large CVA. For further details, please refer to the history and physical.  1. Right frontal parietal CVA. The patient is on aspirin. LDL less than 70. She was started on statin. MRA showed no intracranial arterial stenosis, blockage, or aneurysm. Echo showed no source of embolism. Reviewed tele strips. She did not have atrial fibrillation. However, she did have some junctional rhythms and then went into sinus tachycardia. She does have some confusion. I suspect this is related to her right frontal parietal CVA rather than alcohol.  2. Acute respiratory failure due to right lower lobe pneumonia with bronchospasm. The patient has some mild  wheezing at discharge. She will be on steroids and azithromycin. She was started on Rocephin. 3. Lung cancer. The patient will follow-up with chemo and radiation.  4. Tobacco abuse. The patient was counseled. She is on nicotine patch. 5. Alcohol abuse. On CIWA protocol.  6. Anxiety/depression.  7. Hypokalemia, replete as needed.  8. Right lower lobe pneumonia. On ceftriaxone and azithromycin at discharge. She will be discharged with azithromycin.  9. Hypertension. The patient was started on metoprolol.   DISCHARGE MEDICATIONS:  1. Atorvastatin 10 mg a day.  2. Advair Diskus 250/50 b.i.d.   3. Nicotine patch 21 mg per 24 hours.  4. Azithromycin 500 mg for three days.  5. Albuterol ipratropium q.6 hours p.r.n.  6. Metoprolol 25 mg daily.  7. Aspirin 325 mg daily. 8. Prednisone taper starting at 50 mg, taper every three days by 10 mg.  9. Omeprazole 40 mg daily.  10. Xanax 1 mg p.o. t.i.d.   11. Cetirizine 1 mg/mL 10 mL daily.  12. Fluticasone 50 mcg inhalation daily.  13. Promethazine 25 mg q.6 hours p.r.n. nausea.  14. Trazodone 50 mg at bedtime.  15. Lexapro 20 mg daily.   DISCHARGE DIET: Low sodium diet, low fat, low cholesterol.   DISCHARGE ACTIVITY: As tolerated.   DISCHARGE REFERRAL: Physical therapy.   DISCHARGE FOLLOW-UP: The patient can follow-up with Dr. Ellsworth Lennox, who is her primary care physician, in one week.    TIME SPENT: Approximately 35 minutes.   ____________________________ Janyth Contes. Juliene Pina, MD spm:drc D: 08/14/2011 15:14:08 ET T: 08/15/2011 10:37:25 ET JOB#: [id number]  cc: Brenyn Petrey P. Juliene Pina, MD, <Dictator> Marland Mcalpine  Rolene CourseA. Tejan-Sie, MD Janyth ContesSITAL P Joriel Streety MD ELECTRONICALLY SIGNED 08/21/2011 23:26

## 2014-05-08 NOTE — H&P (Signed)
PATIENT NAME:  Stacey Lynch, Stacey Lynch MR#:  [id number] DATE OF BIRTH:  [birth year]  DATE OF ADMISSION:  09/03/2011  PRIMARY CARE PHYSICIAN: Gerarda Fractionimothy Finnegan, MD  HISTORY OF PRESENT ILLNESS: The patient is a 64 year old Caucasian female with past medical history significant for history of stroke, history of tobacco, alcohol abuse, history of anxiety and depression, chronic obstructive pulmonary disease, also stage IIIA squamous cell lung carcinoma, presented to the hospital with complaints of heart racing. According to the patient, she was lying in skilled nursing facility in the bed when she started noticing her heart racing. She also was somewhat short of breath as well as having some chest pains. It is unclear how long this lasted. The patient felt somewhat dizzy as well as lightheaded but denied any syncopal episodes. She admits of coughing but denies any significant phlegm. She admits of having fevers and especially chills today, feeling feverish and chilly today. The patient's heart rate was 160, 170s. Patient was given 5 mg of Lopressor after which her heart rate went down to 120. The patient's blood pressure dropped down to 70s. She got 2 liters of normal saline bolus and had lab studies done. CT scan of the chest showed no pulmonary embolism; however, it showed right lower lobe mass, which was apparently the same. Hematologist was called and they recommended echocardiogram to be repeated. Patient's hemoglobin level was also found to be low. Hospitalist service was contacted for admission because of SVT as well as anemia and hypertension.   PAST MEDICAL HISTORY: Significant for history of admission in summer this year for right frontoparietal cerebrovascular accident. Patient at that time had acute respiratory failure due to right lower lobe pneumonia, tobacco abuse, alcohol abuse, anxiety, depression, history of chronic obstructive pulmonary disease, hypertension, also stage IIIA squamous lung carcinoma which  was diagnosed via subcarinal lymph node biopsy. The patient received radiation therapy and had her 1st chemotherapy with carboplatin as well as Taxol on August 28, 2011. She was prescribed a Duragesic patch as well as Compazine as well as Ambien most recently for right rib discomfort and  nausea, insomnia.           MEDICATION LIST: Compazine 10 mg.   Dictation ended here.    ____________________________ Katharina Caperima Jolane Bankhead, MD rv:vtd D: 09/03/2011 19:24:55 ET T: 09/04/2011 06:46:36 ET JOB#: [id number]  cc: Katharina Caperima Dujuan Stankowski, MD, <Dictator> Tollie Pizzaimothy J. Orlie DakinFinnegan, MD Katharina CaperIMA Crista Nuon MD ELECTRONICALLY SIGNED 09/19/2011 15:29

## 2014-05-08 NOTE — H&P (Signed)
PATIENT NAME:  Stacey Lynch, Stacey Lynch MR#:  [id number] DATE OF BIRTH:  10-06-1950  DATE OF ADMISSION:  10/07/2011  PRIMARY CARE PHYSICIAN:  Dr. Ellsworth Lennox ONCOLOGIST:  Dr. Preston Fleeting    PRESENTING COMPLAINT: Leg swelling.   HISTORY OF PRESENTATION: This is a 64 year old female with past medical history of lung cancer. She is undergoing chemotherapy and radiation for it. History of right hip fracture four days ago. History of stroke in the past and right hip replacement. She had a small fall and came to Emergency Room  4 to 5 days ago and was noted to have a small fracture of the pubic rami. She was advised to be admitted and get arrangements for rehab, but she declined and went home with pain medication. After that her activities were limited due to pain.  She uses diapers for her bowel and bladder activities and she was using pain medications for her cancer and for hip pain. For last 1 or 2 days she noticed that her legs are more swollen now and so she decided to come to the Emergency Room today. In the Emergency Room on further work-up she was found to have deep vein thrombosis and so was referred for admission.   PAST MEDICAL HISTORY:  1. Lung cancer, chemotherapy and radiation therapy.  2. History of stroke.  3. History of right hip replacement.   PAST SURGICAL HISTORY:  1. Right hip replacement.  2. Gallbladder removal. 3. Tonsillectomy.   ALLERGIES: No known drug allergies.   SOCIAL HISTORY: She is an ex-smoker. She stopped smoking three months ago. She smoked for almost 40 years, drinks alcohol socially, and denies any illegal drug use.   FAMILY HISTORY: Father had colon cancer. Brother had kidney cancer.   REVIEW OF SYSTEMS: CONSTITUTIONAL: Denies any fever, fatigue, or weakness. Pain  due to cancer and right side hip fracture. EYES: Denies any blurring or change in vision. ENT: Denies any ringing in the ears, ear pain, or hearing loss. RESPIRATORY: Denies any cough, wheezing, blood in the  sputum, or shortness of breath. CARDIOVASCULAR: Denies chest pain. Leg edema. No palpitations, syncope, or dizziness. GASTROINTESTINAL: Denies any nausea, vomiting, diarrhea, abdominal pain, hematemesis, or blood in the stool. GENITOURINARY: Denies any dysuria, any blood in the urine, or increased frequency of the urine. MUSCULOSKELETAL: Denies joint pain or swelling. NEUROLOGIC: Denies any numbness, weakness, or any speech problem. PSYCHIATRIC: Denies any anxiety, insomnia, or depression.   PHYSICAL EXAMINATION: VITAL SIGNS:  Temperature 98.5, pulse rate 93, respirations 18, blood pressure 105/69, pulse oximetry 98% on 2 liters nasal cannula oxygen.   GENERAL: No acute distress.   HEENT: Conjunctivae pink. Oral mucosa moist. No icterus present. No signs of trauma.   NECK: Supple. No lymphadenopathy. No JVD.   RESPIRATORY:  Bilateral clear and equal air entry. On the chest and sternum excoriation marks and scarring present possibly secondary to past radiation.   CARDIOVASCULAR: S1, S2 present and regular. No murmur.   GASTROINTESTINAL: No tenderness. Bowel sounds normal. No organomegaly felt.   EXTREMITIES: Bilateral pitting edema on the legs and feet. Right hip tender to pressure.   NEUROLOGICAL: Grossly intact. No weakness or tingling. No tremors.  HOME MEDICATIONS:  1. Metoclopramide 10 mg 3 times a day. 2. Escitalopram 20 mg p.o. daily.  3. Aspirin 325 mg. 4. Prochlorperazine 10 mg 3 times a day. 5. Senna 2 times a day.  6. Trazodone 50 mg at night.  7. Alprazolam 1 mg 3 times a day. 8. Dexamethasone 4 mg  p.o. b.i.d.  9. Metoprolol 25 mg p.o. b.i.d. 10. Lansoprazole 30 mg p.o. daily. 11. Advair Diskus inhalation 2 times a day.   ASSESSMENT: 64 year old female with past medical history of stroke, lung cancer, and hypertension, presented with deep vein thrombosis.   PLAN:  1. Deep vein thrombosis: Lovenox 1 mg per kg subcutaneous 2 times a day. As she has history of cancer and  hip fracture, she is a candidate for IVC filter. Vascular consult. Continue aspirin for stroke.  2. Hypertension: Metoprolol.  3. Urinary tract infection as evident by urinalysis: Rocephin IV. 4. Hip fracture: Continue pain medication.  5. Functional decline due to chronic pain: Physical therapy and care management consult for possible placement in rehab.  All lab results available at this time reviewed.   Condition and plan explained to the patient.  TOTAL TIME SPENT: 50 minutes.   ____________________________ Hope PigeonVaibhavkumar G. Elisabeth PigeonVachhani, MD vgv:bjt D: 10/07/2011 21:05:31 ET T: 10/08/2011 07:41:25 ET JOB#: [id number]  cc: Hope PigeonVaibhavkumar G. Elisabeth PigeonVachhani, MD, <Dictator> Sheikh A. Ellsworth Lennoxejan-Sie, MD Dr. Ma HillockFilossi Arlina Sabina MD ELECTRONICALLY SIGNED 10/13/2011 15:32

## 2014-05-08 NOTE — Discharge Summary (Signed)
PATIENT NAME:  Stacey Lynch, Stacey E MR#:  [id number] DATE OF BIRTH:  06-Jan-1951  DATE OF ADMISSION:  09/03/2011 DATE OF DISCHARGE:  09/04/2011   DISCHARGE DIAGNOSES:  1. Supraventricular tachycardia, currently in sinus rhythm. 2. Dehydration.  3. Hypokalemia. 4. Elevated transaminases. 5. Anemia. 6. History of lung cancer. 7. Chronic obstructive pulmonary disease. 8. Cerebrovascular accident. 9. Hypertension. 10. Gastroesophageal reflux disease. 11. Anxiety and depression.   DISPOSITION: The patient is being discharged back to her facility[facility].   DIET: Low sodium.   ACTIVITY: As tolerated.   DISCHARGE MEDICATIONS:  1. Lexapro 20 mg daily.  2. Promethazine 25 mg every six hours p.r.n.  3. Lipitor 10 mg daily.  4. Thiamine 100 mg daily.  5. Multivitamin 1 tablet daily.  6. Folic acid 1 mg daily.  7. Vitamin B 1000 mcg daily.  8. Omeprazole 40 mg daily.  9. Nicotine patch 21 mg daily.  10. Aspirin 325 mg daily. 11. Cetirizine 5 mg daily.  12. Fentanyl patch 25 mcg every 72 hours.  13. Trazodone 50 mg daily.  14. Xanax 1 mg at bedtime.  15. Trazodone 150 mg 0.5 tablet once a day at bedtime.  16. Ambien 10 mg at bedtime.  17. Senna 50/8.6 mg 2 tablets once a day at bedtime.  18. Advair 250/50, one puff b.i.d.  19. Xanax 0.5 mg b.i.d.  20. Carafate 1 gram q.i.d.  21. Prochlorperazine 10 mg q.6 hours p.r.n.  22. DuoNebs q.6 hours p.r.n.  23. Norco 5/325, one tablet every six hours p.r.n.  24. Xanax 0.5 mg once a day as needed prior to radiation and chemotherapy.  25. Lopressor 25 mg b.i.d.   CONSULTATION: Cardiology consultation with Dr. Gwen PoundsKowalski.   LABORATORY, RADIOLOGICAL AND DIAGNOSTIC DATA: CT of the chest showed no evidence of PE. The patient has known lung cancer with lymphadenopathy. Microbiology: Blood cultures no growth so far. White count normal. Hemoglobin ranging from 10.6 to 9.6. Platelet count normal. BNP 750, glucose 139, potassium 3.1, sodium 134.  Elevated alkaline phosphatase 728, AST 40. Cardiac enzymes negative. TSH normal 2.02.   HOSPITAL COURSE: The patient is a 64 year old female with past medical history of lung cancer, on chemotherapy and radiation therapy, cerebrovascular accident, hypertension, gastroesophageal reflux disease, anxiety, depression, who presented with palpitations. She was found to be in supraventricular tachycardia and the dose of her beta blocker was increased. Currently, she is in normal sinus rhythm. Her last echo was from 07/13 and showed normal ejection fraction of more than 55%. She was evaluated by Dr. Gwen PoundsKowalski during the hospitalization, who felt that the patient was stable for discharge from cardiac standpoint of view since her heart rate was controlled. Her cardiac enzymes have been negative. CT of the chest was negative for any PE. Her TSH was normal. She was also found to be mildly dehydrated and hypokalemic and was hydrated with IV fluids and her potassium was supplemented. Currently, her potassium has normalized. She has elevated transaminases which are stable compared to prior levels. She has anemia, likely of chronic disease compounded by hemodilution. There is a question of urinary tract infection. However, the patient is afebrile with normal white count and no urinary complaints. Therefore, she will receive no further antibiotics at the time of discharge. The patient is discharged back to her facility in a stable condition. She will follow up with her PCP and oncologist as an outpatient.   TIME SPENT: 45 minutes.   ____________________________ Darrick MeigsSangeeta Leigha Olberding, MD sp:ap D: 09/04/2011 11:34:02  ET T: 09/04/2011 11:45:56 ET JOB#: [id number]  cc: Darrick Meigs, MD, <Dictator> [facility] Darrick Meigs MD ELECTRONICALLY SIGNED 09/04/2011 15:01

## 2014-05-08 NOTE — H&P (Signed)
PATIENT NAME:  Stacey Lynch, BUECHNER MR#:  [id number] DATE OF BIRTH:  11/20/1950  DATE OF ADMISSION:  09/03/2011  ADDENDUM  Please refer to my previously dictated part of history and physical.   MEDICATIONS: Patient's medication list is as follows:  1. Advair Diskus 250/50, 1 puff twice daily.  2. Albuterol ipratropium inhalation 3 mL q.6 hours as needed.   3. Ambien 10 mg p.o. at bedtime.  4. Aspirin 325 mg p.o. daily.  5. Atorvastatin 10 mg p.o. at bedtime.  6. Carafate 1 gram slurry 4 times daily.  7. Cetirizine 5 mg p.o. daily.  8. Fentanyl 25 mcg/h transdermal patch once every 72 hours.  9. Folic acid 1 mg p.o. daily.  10. Lexapro 10 mg p.o. daily.  11. Metoprolol 25 mg p.o. once daily. 12. Multivitamin once daily. 13. Nicotine 21 mg transdermal patch once a day.  14. Norco 5/325 mg 1 every six hours.  15. Omeprazole 40 mg p.o. daily.  16. Prochlorperazine 10 mg p.o. every six hours as needed.  17. Promethazine 25 mg p.o. every six hours as needed.  18. Senna S 50/8.6 mg 2 tablets orally at bedtime.  19. Thiamine 100 mg p.o. daily.  20. Trazodone 150 mg tablet 0.5 half which should be 75 mg p.o. at bedtime.  21. Vitamin B12 1000 mcg p.o. daily.  22. Xanax 0.5 mg p.o. daily as needed.  23. Xanax 0.5 mg p.o. twice daily.  24. Xanax 1 mg p.o. at bedtime.   PAST MEDICAL HISTORY: Go back to past medical history and add also:  1. History of right hip fracture in June 2011. 2. History of cerebrovascular accident in the past.  3. History of chronic obstructive pulmonary disease.  4. Hypertension.  5. Tobacco abuse. 6. Gastroesophageal reflux disease. 7. Anxiety, depression. 8. History of positivity for hepatitis A.   PAST SURGICAL HISTORY: Gallbladder surgery.  OXYGEN: Patient was not on oxygen at home.   SOCIAL HISTORY: Patient used to smoke 1 pack per day or more and used to drink at least 6 to 8 drinks per day, now she has completely quit smoking cold [place] according to her.  She used to live with her brother, now in skilled nursing facility. No history of illicit drug abuse.   FAMILY HISTORY: Hypertension.  ALLERGIES: No known drug allergies.   REVIEW OF SYSTEMS: CONSTITUTIONAL: Positive for fevers and chills, fatigue and weakness for the past few days, pains in rib as well as shoulder blade on the right side, also cough as well as wheezing seemed to be more pronounced that it was in the past. Also sinus congestion, some chest pains, palpitations, nausea, about to vomit and constipation. Denies any high fevers, however, admits of feeling chilly. Denies any weight loss or gain. EYES: In regards to eyes, denies any blurry vision, double vision, glaucoma, cataracts. ENT: Denies any tinnitus, allergies, epistaxis, sinus pain, dentures, difficulty swallowing. RESPIRATORY: Denies any hemoptysis. Admits of shortness of breath, however, seems to be chronic. CARDIOVASCULAR: No orthopnea, edema, arrhythmias or syncope. GASTROINTESTINAL: Denies any vomiting, diarrhea, abdominal pain, hematemesis, rectal bleeding, change in bowel habits. GENITOURINARY: Denies dysuria, hematuria, frequency, incontinence. ENDOCRINOLOGY: Denies any polydipsia, nocturia, thyroid problems, heat or cold intolerance, or thirst. HEME: Denies any anemia, easy bruising, bleeding, swollen glands. SKIN: Denies any acne, rash, lesions, change in moles. MUSCULOSKELETAL: Denies arthritis, cramps, swelling, gout. NEUROLOGICAL: Denies any numbness, epilepsy, tremor. PSYCH: Denies anxiety, insomnia or depression.      PHYSICAL EXAMINATION:  VITAL SIGNS: On  arrival to the hospital patient's vitals: Temperature 99.7, pulse 140s to 160s and according to the Emergency Room physician up to 170s, respiration rate 24, blood pressure 101/76, saturation 93% on oxygen therapy.  GENERAL: This is a well-nourished Caucasian female laying on the stretcher.   HEENT: Her pupils are equal, reactive to light. Extraocular movements are  intact. No icterus or conjunctivitis. Has normal hearing. No pharyngeal erythema. Mucosa is moist.   NECK: Neck did not reveal any masses. Supple, nontender. Thyroid not enlarged. No adenopathy. No JVD or carotid bruits bilaterally. Full range of motion.   LUNGS: Clear to auscultation on the right, somewhat diminished breath sounds on the base but otherwise no rales, rhonchi or wheezing. No labored inspirations, increased effort, dullness to percussion, overt respiratory distress. On the left, however, patient does have diminished breath sounds and some wheezing.   CARDIOVASCULAR: S1, S2 appreciated. No murmurs, gallops or rubs noted. PMI not lateralized. Chest is nontender to palpation.   EXTREMITIES: 1+ pedal pulses. No lower extremity edema, calf tenderness, or cyanosis noted.   ABDOMEN: Soft, nontender. Bowel sounds are present. No hepatosplenomegaly or masses were noted.   RECTAL: Deferred.   MUSCULOSKELETAL: Able to move all extremities. No cyanosis, degenerative joint disease, or kyphosis. Gait is not tested. Patient was able to sit up by herself. No adenopathy in cervical region.    NEUROLOGICAL: Cranial nerves grossly intact. Sensory is intact. No dysarthria, aphasia.   PSYCH: Patient is alert, oriented to time, person, place cooperative. Memory is somewhat impaired. She is a little confused intermittently but no agitation or depression was noted.   LABORATORY, DIAGNOSTIC, AND RADIOLOGICAL DATA: Elevated B-type natriuretic peptide of 750, glucose 139, sodium 134, potassium 3.1, otherwise BMP was unremarkable. Magnesium level was normal at 2.3. Patient's albumin level was low at 2.1, alkaline phosphatase elevated to 728, AST 40, however, patient's liver enzymes did not seem to be changing too much since 08/28/2011. At that time her AST was 47 and ALT was also elevated at 102 as well as alkaline phosphatase. Patient's cardiac enzymes, first set, negative. TSH was normal at 2.07. White  blood cell count was normal at 9.8, hemoglobin 10.6, platelet count 324, absolute neutrophil count is not done. Coagulation panel unremarkable. Urinalysis amber cloudy urine, negative for glucose, bilirubin, or ketones, specific gravity 1.027, pH 5.0, negative for blood, 30 mg/dL protein, negative for nitrites or leukocyte esterase, 2 red blood cells, 7 white blood cells, 1+ bacteria, 6 epithelial cells. Mucous is present as well as hyaline casts. A few EKGs were performed and first one was done around 3:00 p.m. showed supraventricular tachycardia at 168 beats per minute, normal axis, no acute ST-T changes. Repeated EKG later in the day around 4:30 showed sinus tachycardia at 116 beats per minute, other normal EKG, no acute ST-T changes were noted. Chest x-ray, portable single view 09/03/2011 showed limited study as patient was not able to sit. May be an element of low grade congestive heart failure according to radiologist. Patient's CT scan of chest with IV contrast 09/03/2011 showed enlarging subcarinal adenopathy. Persistent right lower lobe consolidation or mass with central necrosis, trace pleural thickening or pleural effusion in both lower lung zones were noted. Findings may be consistent with malignant disease or infectious process. No definite pulmonary embolism is evident. Hilar adenopathy is present. Atherosclerotic disease is also present.   ASSESSMENT AND PLAN:  1. SVT. Admit patient to medical floor. Advance metoprolol to 25 mg twice daily dose. Check cardiac enzymes x3.  Get cardiologist involved for further recommendations. Order echo if it was not done recently. Echocardiogram done in 07/2011. At that time ejection fraction was more than 55%, atrial size was normal, mild mitral regurgitation was noted as well as trace tricuspid regurgitation, otherwise unremarkable echocardiogram read by Dr. Harold HedgeKenneth Fath. Will repeat echocardiogram and will get cardiologist involved for recommendations.   2. Dehydration. Will continue patient on IV fluids.  3. Hypokalemia. Supplement IV.  4. Elevated transaminases, seemed to be somewhat similar to 08/28/2011. No intervention here.  5. Anemia. Will check patient's guaiac and will follow patient's hemoglobin level. Hemoglobin level dropped down from 12.0 on 09/01/2011 down to 10.6 on 09/03/2011. In three days hemoglobin level dropped down by 2 grams. I am concerned about the GI bleed. We are initiating PPIs twice a day and will be checking guaiac with following hemoglobin level as outpatient.  6. Tobacco abuse. Discussed with patient. She refuses any nicotine replacement therapy. Discussed for approximately three minutes. Patient has quit tobacco smoking cold [place].  7. Questionable urinary tract infection. Get urine cultures as well as blood cultures. Start patient on Rocephin. Get also CBC differential. If patient is neutropenic will probably need to change different antibiotic.   TIME SPENT: One hour.   ____________________________ Katharina Caperima Timberlee Roblero, MD rv:cms D: 09/03/2011 19:42:40 ET T: 09/04/2011 06:45:28 ET JOB#: [id number]  cc: Katharina Caperima Rajanee Schuelke, MD, <Dictator> Sarafina Puthoff MD ELECTRONICALLY SIGNED 09/19/2011 15:22

## 2014-05-08 NOTE — Consult Note (Signed)
PATIENT NAME:  Stacey Lynch, Stacey Lynch MR#:  [id number] DATE OF BIRTH:  02-Dec-1950  DATE OF CONSULTATION:  09/04/2011  REFERRING PHYSICIAN:  Dr. Dava NajjarPanwar  CONSULTING PHYSICIAN:  Lamar BlinksBruce J. Retina Bernardy, MD  REASON FOR CONSULTATION: Supraventricular tachycardia, hypertension, and hyperlipidemia.   CHIEF COMPLAINT: "I have tachycardia."  HISTORY OF PRESENT ILLNESS: This is a 64 year old female with known hypertension and hyperlipidemia who has had relatively good control of this in the last many months. The patient has had new onset significant shortness of breath with palpitations. She was brought to the Emergency Room and showing that she had a supraventricular tachycardia by EKG and telemetry at a heart rate of approximately 160 beats per minute. She has not had any significant episodes of chest pain during that period of time but did have shortness of breath. After giving her metoprolol the patient did have conversion to normal sinus rhythm. Since then the patient has felt fairly well with an EKG repeated showing normal sinus rhythm, normal EKG. The patient now has had no evidence of elevation of troponin or CK-MB.   REVIEW OF SYSTEMS: Remainder review of systems negative for vision change, ringing in the ears, hearing loss, cough, congestion, heartburn, nausea, vomiting, diarrhea, bloody stools, stomach pain, extremity pain, leg weakness, cramping of the buttocks, known blood clots, headaches, blackouts, dizzy spells, nosebleeds, congestion, trouble swallowing, frequent urination, urination at night, muscle weakness, numbness, anxiety, depression, skin lesions, skin rashes.   PAST MEDICAL HISTORY:  1. Hypertension.  2. Hyperlipidemia.   FAMILY HISTORY: No family members with early onset of cardiovascular disease.   SOCIAL HISTORY: She has remote tobacco use.   ALLERGIES: She has no known drug allergies.   CURRENT MEDICATIONS: As listed.   PHYSICAL EXAMINATION:  VITAL SIGNS: Blood pressure 122/68  bilaterally, heart rate 72 upright, reclining, and regular.   GENERAL: She is a well appearing female in no acute distress.   HEENT: No icterus, thyromegaly, ulcers, hemorrhage, or xanthelasma.   CARDIOVASCULAR: Regular rate and rhythm with normal S1 and S2 without murmur, gallop, or rub. Point of maximal impulse is normal size and placement. Carotid upstroke normal without bruit. Jugular venous pressure normal.   LUNGS: Lungs have few basilar crackles with normal respirations.   ABDOMEN: Soft, nontender without hepatosplenomegaly or masses. Abdominal aorta is normal size without bruit.   EXTREMITIES: 2+ bilateral pulses in dorsal, pedal, radial, and femoral arteries without lower extremity edema, cyanosis, clubbing, ulcers.   NEUROLOGIC: Oriented to time, place, and person with normal mood and affect.   ASSESSMENT: 64 year old female with hypertension, hyperlipidemia, new onset supraventricular tachycardia with palpitations and no current evidence of myocardial infarction or recurrence now on appropriate medications.   RECOMMENDATIONS:  1. Continue beta blocker for hypertension control and maintenance of normal sinus rhythm. 2. Ambulate and follow for any further recurrence of supraventricular tachycardia.  3. Continue aspirin 81 mg as necessary for further risk reduction in stroke and heart attack.  4. No further cardiac diagnostics necessary at this time.  5. Patient okay for discharge to home with follow up as an outpatient for further adjustments  ____________________________ Lamar BlinksBruce J. Sintia Mckissic, MD bjk:cms D: 09/04/2011 09:11:28 ET T: 09/04/2011 10:46:12 ET  JOB#: [id number] cc: Lamar BlinksBruce J. Lavone Weisel, MD, <Dictator> Lamar BlinksBRUCE J Cerita Rabelo MD ELECTRONICALLY SIGNED 09/05/2011 9:32

## 2014-05-11 NOTE — Consult Note (Signed)
DATE OF BIRTH:  Jul 06, 1950  DATE OF CONSULTATION:  02/08/2012  REFERRING PHYSICIAN:  Dr. Cherlynn Kaiser.  CONSULTING PHYSICIAN:  Marcina Millard, MD  PRIMARY CARE PHYSICIAN:  Dr. Ellsworth Lennox.  CHIEF COMPLAINT:  Altered mental status.   REASON FOR CONSULTATION:  Consultation requested for evaluation of atrial fibrillation with a rapid ventricular rate.   HISTORY OF PRESENT ILLNESS:  The patient is a 64 year old female currently a resident of [facility], who was sent to [facility] Emergency Room for altered mental status. Upon presentation, the patient was noted to be tachycardic. She reportedly was in atrial fibrillation with a rapid rate initially of 120, which became 140 to 150. The patient was hypotensive with blood pressures in the 60s to 70s. The patient was started on Levophed. She was placed on diltiazem drip and has converted to sinus rhythm. The patient denies chest pain.   PAST MEDICAL HISTORY:  1.  Paroxysmal atrial fibrillation.  2.  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  3.  Hypertension.  4.  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  6.  History of DVT.  7.  Gastroesophageal reflux disease.   MEDICATIONS:  Aspirin 81 mg daily, Lovaza 1000 mg 2 capsules b.i.d., Coumadin 2.5 mg daily, Tylenol 650 mg q. 4 p.r.n., Astelin nasal spray 2 sprays each nostril daily, baclofen 10 mg t.i.d., Cymbalta 60 mg daily, Duragesic patch 75 mcg q. 72 hours, Dulcolax suppository p.r.n., Flomax nasal spray each nostril daily, Levaquin 750 mg daily, Lexapro 20 mg daily, Lomotil 1 tablet q. 2 to 3 hours p.r.n., Megace 10 mL t.i.d., omeprazole 40 mg daily, Percocet 5/325 one q. 6 p.r.n., promethazine 25 mg q. 4 to 6 hours p.r.n., Ultracet 37.5/325 mg q. 6 p.r.n., Xanax 1 mg t.i.d.   SOCIAL HISTORY:  The patient has a 40 pack-year tobacco abuse history. She currently is residing in a skilled nursing facility.   FAMILY HISTORY:  No  immediate family history of coronary artery disease or myocardial infarction.   REVIEW OF SYSTEMS:  Difficult to obtain.  CONSTITUTIONAL:  No fever or chills.  EYES:  No blurry vision.  EARS:  No hearing loss.  RESPIRATORY:  The patient does have some shortness of breath.  CARDIOVASCULAR:  The patient denies chest pain, palpitations.  GASTROINTESTINAL:  No nausea, vomiting, diarrhea or constipation.  GENITOURINARY:  No dysuria or hematuria.  ENDOCRINE:  No polyuria or polydipsia.  MUSCULOSKELETAL:  No arthralgias or myalgias. The patient does have chronic pain syndrome.  NEUROLOGICAL:  The patient has altered mental status and is oriented to name only.   PHYSICAL EXAMINATION:  VITAL SIGNS:  Blood pressure was 144/70, pulse was 110 and regular. GENERAL:  The patient is alert, somewhat agitated.  HEENT:  Pupils equal and reactive to light and accommodation.  NECK:  Supple without thyromegaly.  LUNGS:  Clear.  HEART:  Normal JVP. Normal PMI. Regular rate and rhythm. Normal S1, S2. No appreciable gallop, murmur or rub.  ABDOMEN:  Soft, nontender. Pulses were intact bilaterally.  MUSCULOSKELETAL:  Normal muscle tone.  NEUROLOGICAL:  The patient was oriented to name only. Motor and sensory were grossly intact.   IMPRESSION:  A 64 year old female, who presents with altered mental status initially with atrial fibrillation with rapid rate and hypotension, which resolved after fluid resuscitation on Levophed. The patient currently is in sinus rhythm, clinically hemodynamically appears stable.   RECOMMENDATIONS:  1.  Continue diltiazem drip for now.   2.  Load with digoxin 0.25 mg IV q. 2 hours for 4 doses.  3.  Taper Levophed to off since the patient appears to be normotensive.  4.  No further cardiac diagnostics at this time.     ____________________________ Marcina MillardAlexander Alston Berrie, MD ap:ms D: 02/08/2012 22:01:21 ET T: 02/08/2012 23:30:27 ET JOB#: [id number]  cc: Marcina MillardAlexander Meeghan Skipper, MD,  <Dictator> Marcina MillardALEXANDER Markasia Carrol MD ELECTRONICALLY SIGNED 03/15/2012 14:56

## 2014-05-11 NOTE — H&P (Signed)
DATE OF BIRTH:  05-Feb-1950  PRIMARY CARE PHYSICIAN:  Dr. Ellsworth Lennoxejan-Sie.   CHIEF COMPLAINT:  Altered mental status and rapid heart rate.   HISTORY OF PRESENT ILLNESS:  This is a 64 year old female, who was sent over from [facility] due to altered mental status. The patient herself is a very poor historian. She has a waxing and waning mental status, which seems to be her baseline. Therefore, most of the history obtained from the chart and also from the ER physician. The patient apparently was recently in the hospital in December 2013, treated for hospital-acquired pneumonia and sent back to the skilled nursing facility. She now comes back in due to lethargy, weakness and altered mental status. She was just recently seen by oncologist, Dr. Orlie DakinFinnegan, about 5 days ago, and as per his note, she was also having some mental status changes then. When she came to the ER here, she was not febrile, but was noted to be tachycardic with heart rates anywhere in the 120s to 130s. Her heart rate since then has gone up to the 140s-150s, and she has become hypotensive in the 60s to 70s. Hospitalist services were contacted for further treatment and evaluation.   REVIEW OF SYSTEMS:  Otherwise unobtainable given the patient's mental status.   PAST MEDICAL HISTORY:  Consistent with squamous cell lung cancer stage III status post chemoradiation, hypertension, chronic pain syndrome secondary to malignancy, gastroesophageal reflux disease, depression, history of DVT.   ALLERGIES:  AMBIEN WHICH CAUSES OVERSEDATION.   SOCIAL HISTORY:  She used to be a long-time smoker, quit when she was diagnosed with lung cancer. She does have a 30 to 40 pack-year smoking history. No alcohol abuse. No illicit drug abuse. She lives in a skilled nursing facility at [facility].   FAMILY HISTORY:  Father had colon cancer. Brother had kidney cancer.   CURRENT MEDICATIONS:  Tylenol 650 q. 4 hours as  needed, aspirin 81 mg daily, Astelin spray 2 sprays to each nostril daily, baclofen 10 mg t.i.d., Cymbalta 60 mg daily, Duragesic patch 75 mcg q. 72 hours, Dulcolax suppository as needed, Flonase 1 spray to each nostril daily, Levaquin 750 mg daily, Lexapro 20 mg daily, Lomotil 1 tab q. 2 to 3 hours as needed for nausea and diarrhea, Lovaza 1000 mg 2 caps b.i.d., Megace 10 mL t.i.d., omeprazole 40 mg daily, Percocet 5/325 one tab q. 6 hours as needed, promethazine 25 mg q. 4 to 6 hours as needed, Ultracet 37.5/325 one tab q. six hours as needed, Xanax 1 mg t.i.d. and Coumadin 2.5 mg daily.   PHYSICAL EXAMINATION:  VITAL SIGNS:  Temperature, she is afebrile at 97.8; pulse is in the 130s to 150s and regular; respirations 24; blood pressure 86/65; sats 94% on 2 L nasal cannula.  GENERAL:  She is a lethargic appearing female, slightly confused, in mild distress.  HEENT:  On exam, she is atraumatic, normocephalic. Her extraocular muscles are intact. Her pupils are equal and reactive to light. Sclerae are anicteric. No conjunctival injection. No pharyngeal erythema.  NECK:  Supple. There is no jugular venous distention. No bruits, no lymphadenopathy, no thyromegaly.  HEART:  Exam is irregular, tachycardic. No murmurs, no rubs, no clicks.  LUNGS:  She has some coarse upper airway crackles, otherwise poor air entry bilaterally. Negative use of accessory muscles, dullness to percussion.  ABDOMEN:  Soft, flat, nontender, nondistended. Has good bowel sounds. No hepatosplenomegaly appreciated.  EXTREMITIES:  No evidence of any cyanosis, clubbing  or peripheral edema. Has +2 pedal and radial pulses bilaterally.  NEUROLOGICAL:  The patient is alert, awake and oriented x 1. It is difficult to do a full neurological exam. Moves all extremities spontaneously.  SKIN:  Exam is moist and warm with no rashes.  LYMPHATIC:  There is no cervical or axillary lymphadenopathy.   LABORATORY EXAMINATION:  Serum glucose of 82, BUN  10, creatinine 0.5, sodium 137, potassium 3.3, chloride 101, bicarb 29. Albumin is 2.3, AST is 18, ALT 11. Troponin less than 0.02. White cell count 13, hemoglobin 8.8, hematocrit 28.4, platelet count of 532. INR is 1.1. Urinalysis is within normal limits.   The patient did have a chest x-ray done, which showed a stable chest with cardiomegaly and pulmonary interstitial prominence, congestive heart failure, pneumonitis could be present in this fashion. The patient also had a CT of the head done without contrast showing no acute intracranial process.   ASSESSMENT AND PLAN:  This is a 64 year old female with a history of stage III lung cancer, chronic obstructive pulmonary disease, anxiety, gastroesophageal reflux disease, chronic pain syndrome, who presents to the hospital due to altered mental status, but also noted to be in rapid atrial fibrillation and hypotension.  1.  Atrial fibrillation. The patient's heart rates are severely uncontrolled in the 130s to 150s. She has a history of paroxysmal atrial fibrillation. I tried to slow her down with some adenosine, but it did not seem to control the heart rate or break her supraventricular tachycardia. Since she is hypotensive, I will try to give her some IV digoxin and also try to give her a low dose Cardizem drip if tolerated. Follow her hemodynamics. Follow her heart rate. She is already on Coumadin, but the INR is subtherapeutic. I will continue that. Follow PT and INR. We will also get a cardiology consult. The patient is known to the [facility]. I discussed the case with Dr. Darrold Junker.  2.  Hypotension. I presume this is probably related to her uncontrolled atrial fibrillation. I do not suspect any evidence of hypovolemic or septic shock. I will go ahead and give her some IV fluids for now. Also start her on some low dose IV vasopressors with Levophed trying to maintain MAPs greater than 60 and follow hemodynamics.  3.  Stage III lung cancer. The  patient has a very poor prognosis. The patient is followed by Dr. Orlie Dakin. I will consult him. I also will get palliative care consult to discuss goals of care with the patient and family.  4.  Gastroesophageal reflux disease. Continue with Prilosec.  5.  Anxiety/depression. Continue with Lexapro and Cymbalta and p.r.n. Xanax.  6.  Altered mental status. The exact etiology of this is unclear. I think she has a baseline waxing and waning mental status. Her CT head is negative. I do not appreciate any evidence of any acute metabolic or infectious source of her altered mental status now. We will continue to follow at this point.   The patient is a FULL CODE. The plan was discussed with the patient's brother, who is in agreement.   CRITICAL CARE TIME SPENT:  60 minutes.   ____________________________ Rolly Pancake. Cherlynn Kaiser, MD vjs:ms D: 02/08/2012 20:28:04 ET T: 02/08/2012 20:54:03 ET JOB#: [id number]  cc: Rolly Pancake. Cherlynn Kaiser, MD, <Dictator> Houston Siren MD ELECTRONICALLY SIGNED 02/09/2012 16:27

## 2014-05-11 NOTE — Consult Note (Signed)
    Comments   Followup visit made. Pt agitated, confused, climbing out of bed. Resistant to staff redirection. Hesitate to use sedating meds at this point due to AMS. Will order sitter. May need haldol for agitation. Will follow.   Electronic Signatures: Tinzley Dalia, Daryl EasternJoshua R (NP)  (Signed 21-Jan-14 12:01)  Authored: Palliative Care   Last Updated: 21-Jan-14 12:01 by Malachy MoanBorders, Nik Gorrell R (NP)

## 2014-05-11 NOTE — Consult Note (Signed)
History of Present Illness:   Reason for Consult Lung cancer, declining performance status.    Date of Diagnosis 17-Jul-2011    HPI   Patient last seen in clinic on January 13.  He has not received any chemotherapy since December 07, 2011.  She is recently admitted with altered mental status.  Currently she is confused and unable to provide a review of systems.  Previously she had a poor appetite and continued to lose weight. There is no report of chest pain or cough. Her confusion had becoming worse over the past several weeks, but no other neurologic complaints were noted. No family at bedside to provide further information.  PFSH:   Additional Past Medical and Surgical History CVA, depression, COPD, anxiety, cholecystectomy.  Family history: Father with colon cancer, brother with bladder cancer.  Social history: Tobacco as above.  Occasional alcohol.   Review of Systems:   Performance Status (ECOG) 4    Review of Systems   Unable to obtain   NURSING NOTES: **Vital Signs.:   21-Jan-14 14:31    Vital Signs Type: Upon Transfer    Temperature Temperature (F): 98.4    Celsius: 36.8    Pulse Pulse: 104    Respirations Respirations: 12    Systolic BP Systolic BP: 370    Diastolic BP (mmHg) Diastolic BP (mmHg): 80    Mean BP: 93    Pulse Ox % Pulse Ox %: 90    Pulse Ox Activity Level: At rest    Oxygen Delivery: 2L   Physical Exam:   Physical Exam General: Ill-appearing. Lungs: Clear to auscultation bilaterally. Heart: Regular rate and rhythm. No rubs, murmurs, or gallops. Abdomen: Soft, nontender, nondistended. No organomegaly noted, normoactive bowel sounds. Musculoskeletal: No edema, cyanosis, or clubbing. Neuro: Confused. Skin: No rashes or petechiae noted. Psych: Agitated    Zolpidem: Other    Levaquin 750 mg oral tablet: 1 tab(s) orally once a day, Active, 0, None   megestrol suspension 40 mg/mL: 10 milliliter(s) orally 3 times a day, Active, 900,  2   Duragesic-75 75 mcg/hr film, extended release: 1 PATCH transdermal every 72 hours x 30 days, As Needed, Active, 10, None   Aspirin Enteric Coated 81 mg oral delayed release tablet: 1 tab(s) orally once a day, Active, 0, None   omeprazole 40 mg oral delayed release capsule: 1 cap(s) orally once a day, Active, 0, None   Percocet 5/325 oral tablet: 1 tab(s) orally every 6 hours, Active, 0, None   promethazine 25 mg oral tablet: 1 tab(s) orally every 4-6 hours as needed for nausea/vomiting, Active, 0, None   Xanax 1 mg oral tablet: 1 tab(s) orally 3 times a day, Active, 0, None   Astelin 137 mcg/inh nasal spray: 2 spray(s) nasal once a day, Active, 0, None   baclofen 10 mg oral tablet: 1 tab(s) orally 3 times a day, Active, 0, None   Flonase 50 mcg/inh nasal spray: 1 spray(s) nasal once a day, Active, 0, None   Lexapro 20 mg oral tablet: 1 tab(s) orally once a day, Active, 0, None   Cymbalta 60 mg oral delayed release capsule: 1 cap(s) orally once a day, Active, 0, None   Lovaza 1000 mg oral capsule: 2 cap(s) orally 2 times a day, Active, 0, None   Ultracet 37.5 mg-325 mg oral tablet: 1 tab(s) orally every 6 hours, As Needed- for Pain , Active, 0, None   Lomotil 2.5 mg-0.025 mg oral tablet: 1 tab(s) orally every 3 hours,  As Needed- for Nausea, Active, 0, None   Dulcolax Laxative 5 mg oral delayed release tablet: 2 tab(s) orally once a day, As Needed- for Constipation , Active, 0, None   acetaminophen 325 mg oral tablet: 2 tab(s) orally every 4 hours, As Needed- for Pain , for Fever , Active, 0, None  Laboratory Results: Routine Chem:  21-Jan-14 04:47    Potassium, Serum  3.0   Glucose, Serum 85   BUN 7   Creatinine (comp)  0.52   Sodium, Serum 140   Chloride, Serum 106   CO2, Serum 27   Calcium (Total), Serum  8.2   Anion Gap 7   Osmolality (calc) 277   eGFR (African American) >60   eGFR (Non-African American) >60 (eGFR values <23m/min/1.73 m2 may be an indication of  chronic kidney disease (CKD). Calculated eGFR is useful in patients with stable renal function. The eGFR calculation will not be reliable in acutely ill patients when serum creatinine is changing rapidly. It is not useful in  patients on dialysis. 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  Plan:   1.  Lung cancer:  Recent PET scan results noted with likely residual malignancy.  No further chemotherapy is planned at this time given her declining performance status and confusion.  Appreciate palliative care input. Pain: Duragesic patch has been removed secondary to possible cause of her altered mental status.  Patient receiving PRN morphine. Altered mental status: Possibly medication related, although likely multifactorial.  No treatment planned as above.  Would consider hospice referral at this point. consult, will follow.  Electronic Signatures: FDelight Hoh(MD)  (Signed 21-Jan-14 17:06)  Authored: HISTORY OF PRESENT ILLNESS, PFSH, ROS, NURSING NOTES, PE, ALLERGIES, HOME MEDICATIONS, LABS, ASSESSMENT AND PLAN   Last Updated: 21-Jan-14 17:06 by FDelight Hoh(MD)

## 2014-05-11 NOTE — Consult Note (Signed)
PATIENT NAME:  Stacey Lynch, Stacey Lynch MR#:  [id number] DATE OF BIRTH:  1950/02/19  DATE OF CONSULTATION:  01/08/2012  CONSULTING PHYSICIAN:  Knute Neuobert G. Lorre NickGittin, MD  HISTORY AND REASON FOR CONSULTATION:  Stacey Lynch is a 64 year old who is followed by [facility]Fin[facility]gan [facility] with stage IIIa squamous cancer of the lung. She has completed chemotherapy and Taxol and Carboplatin therapy and then two treatments with Taxol and Carboplatin consolidation. She had been off treatment for a month with what has been described as a poor performance status, complications, recent hospitalizations for pneumonia. She had been planned to have a restaging PET scan next week and then consideration of further therapy.  Her past history includes history of depression, CVA, COPD, also cholecystectomy and atrial fibrillation. She was recently hospitalized with pneumonia. She also recently had a DVT and was placed on Coumadin. She was re-hospitalized December 19th with increased breathing, and a nonproductive cough, and tachycardia, and the question of a new left lobe infiltrate or CHF. The patient was started on antibiotics and treated for COPD. X-ray shows pulmonary congestion.   ALLERGIES: SHE HAS A STATED ALLERGY TO AMBIEN.   SOCIAL HISTORY: History of alcohol use. She reportedly quit since diagnosis of lung cancer, and she is a long-time smoker. It is written the patient is a Jehovah's Witness on previous by another provider.   REVIEW OF SYSTEMS: Currently unable to get a full system review.  The patient is lethargic. She does wake up. She does deny that she is in pain, denies that she is coughing. She says she wants to sleep but does not answer questions consistently. On system review, she denied any weakness. She could state that she was in the hospital for pneumonia and being sick. She could not give other details of her past history.   PHYSICAL EXAMINATION: GENERAL: There is some pallor.  LYMPHADENOPATHY:  There is no  palpable lymph node in the axillae, submandibular or axilla.  LUNGS: She has abnormal breath sounds with coarse rhonchi and some rales on the left mid zone and base, some decreased air entry at the right base.  ABDOMEN: Nontender. No palpable mass or organomegaly.   EXTREMITIES: No significant extremity edema, was moving all extremities.   LABORATORY, DIAGNOSTIC AND RADIOLOGICAL DATA:  On admission, the creatinine was 0.61. Liver functions were unremarkable with a high alkaline phosphatase. White count was 19,000, hemoglobin 9.5 and dropped to 8.6, platelets are in the 500s. INR was 1.8 but dropped to 1.2.   She had atrial fibrillation. She had an x-ray that was described as left lobe infiltrate. The radiologist has read it as diffuse interstitial increased markings.   IMPRESSION AND PLAN: The patient was thought to have infection but no definite sepsis. She has leukocytosis. She may have a viral or bacterial pneumonia. She has been seen by Infectious Disease, who did not see definite indications of pneumonia. O2 saturation is okay on oxygen. She has a repeat ultrasound that showed what appears to be persistence of a clot demonstrated previously. Her INR is subtherapeutic. It looks like it has been subtherapeutic.   From the hematology and oncology point of view, the patient should go and have her PET scan when she is able to tolerate the scan and later a decision about treatment. Currently performance status is poor, and there is nothing acutely to offer from hematology and oncology. Without  bulk disease, there is no reason to, and currently she is too ill to take aggressive chemotherapy.  There are multiple medical issues being followed by Medicine including she is subtherapeutic with a clot and immobile and at high risk of clot, and I am  recommending heparin drip until she is therapeutic. She is also with poor performance status and poor mental status, although the patient's brother was contacted  tonight and said that her being confused in answering questions, and being sleepy, and not being consistent, and not being able to give a good history sounds like her baseline. Nonetheless, being on blood thinners, I would want at least a noncontrast head CT. Later, she should get a brain MRI. I would reduce her pain medications. Also, I would consider Lasix, given the radiologist's evaluation of the x-ray. Dr. Orlie Dakin will follow up on December 23rd.   ____________________________ Knute Neu Lorre Nick, MD rgg:cb D: 01/08/2012 18:17:28 ET T: 01/09/2012 14:41:12 ET JOB#: [id number]  cc: Knute Neu. Lorre Nick, MD, <Dictator> Marin Roberts MD ELECTRONICALLY SIGNED 03/04/2012 21:13

## 2014-05-11 NOTE — Discharge Summary (Signed)
PATIENT NAME:  Stacey Lynch, Stacey Lynch MR#:  [id number] DATE OF BIRTH:  Oct 19, 1950  DATE OF ADMISSION:  02/08/2012 DATE OF DISCHARGE:  02/11/2012  DISCHARGE DIAGNOSES:  Altered mental status due to toxic encephalopathy secondary to medication, urinary tract infection, metastatic lung cancer, diastolic congestive heart failure.   PRIMARY CARE PHYSICIAN:  Dr. Ellsworth Lennoxejan-Sie   HISTORY OF PRESENT ILLNESS:  This is a 64 year old female sent over from [facility] skilled nursing facility due to altered mental status. The patient is a very poor historian. She was waxing and waning in mental status when she came to the Emergency Room and most of the history was obtained from the chart and ER physician. She was recently in the hospital in December 2013 treated for hospital-acquired pneumonia and sent back to the skilled nursing facility. She came back due to lethargy, weakness and altered mental status. She was seen by oncologist, Dr. Orlie DakinFinnegan, about 5 days ago, presenting to the ER and as per him, she had some mental status changes at that time, but she was not febrile. The patient noted having tachycardia at a rate of 120 to 130 and had gone up to 140 to 150 and was also hypotensive, 60s and 70s.   HOSPITAL COURSE:   1.  Initially, for the tachycardia and paroxysmal atrial fibrillation, _____ with Coumadin. She was also on digoxin and Coumadin that was continued and cardiology consult was done. Cardiology advised to continue with Cardizem, digoxin.  2.  Hypotension. It seems that the hypotension was due to atrial fibrillation. Once the A fib was controlled and rate was controlled, her blood pressure came up.  3.  Altered mental status. Possibly, the altered mental status was secondary to excessive medication use, which was for pain and for psychiatric illnesses. We stopped all the medications in the hospital and the patient had significant improvement in her mental status in the next 2 days. She woke up and she was  talking in the next 2 days in the hospital, so we advised not to give these medications while she is discharged back to the nursing home.  4.  Urinary tract infection. Her UA was positive and her urine culture was positive with 20,000 CFU with ESBL, sensitive to Bactrim, and so though it is only 20,000 CFU but it is ESBL, we discharged her with Bactrim to nursing home.  5.  CHF, mild, diastolic, acute. Echo in June was done, ejection fraction 55% and normal LV function, but she presented with some shortness of breath. We gave Lasix and she had improvement in her condition.  6.  Anxiety and depression disorder. We continued Lexapro, but will hold Cymbalta and Xanax.  7.  Stage III lung cancer, very poor prognosis. Dr. Orlie DakinFinnegan followed in the hospital. Palliative care consult was also done and Dr. Orlie DakinFinnegan was not planning to start further chemotherapy at this point.   IMPORTANT LAB RESULTS DURING THE HOSPITAL STAY:  On admission, chest x-ray showed stable chest, cardiomegaly, pulmonary interstitial prominence, congestive heart failure and pneumonitis. 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 Total WBC was 13,000, hemoglobin was 8.8, platelet count was 532 and MCV was 87 on admission. Creatinine was 0.59 on admission, potassium was 3.3, chloride was 101 and CO2 was 29. Magnesium was 1.8. INR was 1.1 on admission. Urinalysis was positive with 11 WBCs and the culture, as discussed, above 20,000 colony-forming units with  ESBL.   CONSULTATIONS DURING THE HOSPITAL STAY:  Dr. Orlie Dakin for oncology, Dr. Darrold Junker for cardiology and palliative care consult.   CONDITION ON DISCHARGE:  Stable.   CODE STATUS ON DISCHARGE:  Full code.   DISCHARGE MEDICATIONS:  Aspirin enteric-coated 81 mg once a day, omeprazole 40 mg delayed-release capsule once a day, promethazine 25 mg oral tablet for nausea  and vomiting, baclofen 10 mg oral tablet 3 times a day, Flonase 50 mcg per inhaled nasal spray, Lovaza 1000 mg oral capsule 2 capsules orally 2 times a day, megestrol suspension 40 mg per mL 10 mL 3 times a day, Lomotil 2.5 mg, Dulcolax laxative 5 mg 2 tablets orally once a day, escitalopram 20 mg oral tablet once a day, acetaminophen 325 mg oral tablet 2 tablets every 4 hours for pain, prednisone 10 mg oral tablets start 60 mg and taper by 10 mg daily until complete, warfarin 2.5 mg oral tablet once a day, Cardizem 240 mg 24-hour capsule once a day 30 days, Bactrim 800 mg - 160 mg oral tablet every 12 hours for 5 days.   HOME HEALTH:  Yes.   PHYSICAL THERAPY:  At nursing home.   TREATMENTS:  None.   HOME OXYGEN:  Yes.   PORTABLE TANK:  Yes.   OXYGEN DELIVERY:  2 liters nasal cannula.   DIET:  Low-sodium, low-fat and low-cholesterol diet, regular consistency.   ACTIVITY LIMITATION:  As tolerated.   TIMEFRAME TO FOLLOW:  In 1 to 2 days to check INR and check renal function in 2 to 3 days. Need to follow up with [facility] for further decision about starting chemotherapy. Follow up with Dr. Orlie Dakin and Dr. Ellsworth Lennox.   TOTAL TIME SPENT ON THIS DISCHARGE:  45 minutes.    ____________________________ Hope Pigeon Elisabeth Pigeon, MD [id number] D: 02/11/2012 15:47:00 ET T: 02/11/2012 16:35:17 ET JOB#: [id number]  cc: Hope Pigeon. Elisabeth Pigeon, MD, <Dictator> Altamese Dilling MD ELECTRONICALLY SIGNED 03/11/2012 17:47
